# Patient Record
Sex: Male | Born: 1935 | Race: White | Hispanic: No | Marital: Married | Smoking: Current every day smoker
Health system: Southern US, Community
[De-identification: ages and names within clinical notes are randomized; demographics above are authoritative.]

## PROBLEM LIST (undated history)

## (undated) DIAGNOSIS — T4145XA Adverse effect of unspecified anesthetic, initial encounter: Secondary | ICD-10-CM

## (undated) DIAGNOSIS — I509 Heart failure, unspecified: Secondary | ICD-10-CM

## (undated) DIAGNOSIS — Z720 Tobacco use: Secondary | ICD-10-CM

## (undated) DIAGNOSIS — C61 Malignant neoplasm of prostate: Secondary | ICD-10-CM

## (undated) DIAGNOSIS — Z9981 Dependence on supplemental oxygen: Secondary | ICD-10-CM

## (undated) DIAGNOSIS — F329 Major depressive disorder, single episode, unspecified: Secondary | ICD-10-CM

## (undated) DIAGNOSIS — F32A Depression, unspecified: Secondary | ICD-10-CM

## (undated) DIAGNOSIS — J189 Pneumonia, unspecified organism: Secondary | ICD-10-CM

## (undated) DIAGNOSIS — I10 Essential (primary) hypertension: Secondary | ICD-10-CM

## (undated) DIAGNOSIS — M1990 Unspecified osteoarthritis, unspecified site: Secondary | ICD-10-CM

## (undated) DIAGNOSIS — T8859XA Other complications of anesthesia, initial encounter: Secondary | ICD-10-CM

## (undated) DIAGNOSIS — F419 Anxiety disorder, unspecified: Secondary | ICD-10-CM

## (undated) DIAGNOSIS — G43909 Migraine, unspecified, not intractable, without status migrainosus: Secondary | ICD-10-CM

## (undated) DIAGNOSIS — J449 Chronic obstructive pulmonary disease, unspecified: Secondary | ICD-10-CM

## (undated) HISTORY — PX: PROSTATE BIOPSY: SHX241

## (undated) HISTORY — PX: CATARACT EXTRACTION: SUR2

---

## 2003-05-30 HISTORY — PX: MASS EXCISION: SHX2000

## 2003-06-19 ENCOUNTER — Ambulatory Visit (HOSPITAL_COMMUNITY): Admission: RE | Admit: 2003-06-19 | Discharge: 2003-06-19 | Payer: Self-pay | Admitting: General Surgery

## 2006-11-23 ENCOUNTER — Ambulatory Visit: Admission: RE | Admit: 2006-11-23 | Discharge: 2007-02-08 | Payer: Self-pay | Admitting: Radiation Oncology

## 2010-10-14 NOTE — H&P (Signed)
NAME:  Patrick Hobbs, Patrick Hobbs                           ACCOUNT NO.:  [id number]   MEDICAL RECORD NO.:  [id number]                  PATIENT TYPE:   LOCATION:                                       FACILITY:   PHYSICIAN:  Dalia Heading, M.D.               DATE OF BIRTH:  21-Aug-1933   DATE OF ADMISSION:  DATE OF DISCHARGE:                                HISTORY & PHYSICAL   CHIEF COMPLAINT:  Enlarging mass, left ear.   HISTORY OF PRESENT ILLNESS:  Patient is a 77 year old white male who was  referred for evaluation and treatment of a mass behind his left ear.  It has  been present for some time, but recently it has been increasing in size.  No  drainage has been noted.   PAST MEDICAL HISTORY:  Includes hypertension.   PAST SURGICAL HISTORY:  Unremarkable.   CURRENT MEDICATIONS:  Blood pressure pills.   ALLERGIES:  No known drug allergies.   REVIEW OF SYSTEMS:  Patient does smoke daily.  He denies any significant  alcohol use.  He denies any recent MI, angina, shortness of breath.  He  denies any bleeding disorders.   PHYSICAL EXAMINATION:  GENERAL:  Patient is a well-developed and well-  nourished white male in no acute distress.  He is afebrile.  VITAL SIGNS:  Stable.  HEENT:  A 2 cm cystic mass in the left posterior auricular region with a  wide base.  LUNGS:  Clear to auscultation with equal breath sounds bilaterally.  HEART:  Regular rate and rhythm without S3, S4, or murmurs.   IMPRESSION:  Cystic mass, left ear.   PLAN:  Patient is scheduled for excision of the mass, left ear, on June 19, 2003.  The risks and benefits for the procedure, including bleeding and  infection, were fully explained to the patient.  Gave informed consent.     ___________________________________________                                         Dalia Heading, M.D.   MAJ/MEDQ  D:  06/11/2003  T:  06/11/2003  Job:  [id number]   cc:   Kingsley Callander. Ouida Sills, M.D.  [address]  [place]  [place]  [address]  Fax: [phone]

## 2010-10-14 NOTE — Op Note (Signed)
NAME:  Patrick Hobbs, Patrick Hobbs                       ACCOUNT NO.:  [id number]   MEDICAL RECORD NO.:  [id number]                   PATIENT TYPE:  AMB   LOCATION:  DAY                                  FACILITY:  [facility]   PHYSICIAN:  Dalia Heading, M.D.               DATE OF BIRTH:  [birth year]   DATE OF PROCEDURE:  06/19/2003  DATE OF DISCHARGE:                                 OPERATIVE REPORT   PREOPERATIVE DIAGNOSIS:  Enlarging mass, left posterior auricle.   POSTOPERATIVE DIAGNOSIS:  Enlarging mass, left posterior auricle, cyst.   PROCEDURE:  Excision of a 2 cm mass, left posterior auricle.   SURGEON:  Dalia Heading, M.D.   ANESTHESIA:  General.   INDICATIONS:  The patient is a 77 year old white male who presents with an  enlarging mass behind his left ear.  The risks and benefits of the procedure  including bleeding and infection were fully explained to the patient, who  gave informed consent.   PROCEDURE NOTE:  The patient was placed in the supine position.  After  general anesthesia was administered, the left posterior auricular region was  prepped and draped using the usual sterile technique with Betadine.  Surgical site confirmation was performed.   Incision was made over the mass. A sebaceous cyst was found.  The cyst was  excised in total without difficulty.  Any bleeding was controlled using  Bovie electrocautery.  The skin was reapproximated using 5-0 nylon  interrupted sutures.  Neosporin ointment was then applied.   All tape and needle counts were correct at the end of the procedure.  The  patient was awakened and transferred to PACU in stable condition.   COMPLICATIONS:  None.   SPECIMEN:  Sebaceous cyst.   BLOOD LOSS:  Minimal.      ___________________________________________                                            Dalia Heading, M.D.   MAJ/MEDQ  D:  06/19/2003  T:  06/19/2003  Job:  [id number]   cc:   Kingsley Callander. Ouida Sills, M.D.  [address]  [place]  [place] [address]  Fax: [phone]

## 2015-07-20 ENCOUNTER — Encounter (HOSPITAL_COMMUNITY): Payer: Self-pay | Admitting: *Deleted

## 2015-07-20 ENCOUNTER — Inpatient Hospital Stay (HOSPITAL_COMMUNITY)
Admission: EM | Admit: 2015-07-20 | Discharge: 2015-07-22 | DRG: 871 | Disposition: A | Discharge status: Home / Self Care | Payer: Medicare Other | Attending: Internal Medicine | Admitting: Internal Medicine

## 2015-07-20 ENCOUNTER — Emergency Department (HOSPITAL_COMMUNITY): Payer: Medicare Other

## 2015-07-20 DIAGNOSIS — A419 Sepsis, unspecified organism: Principal | ICD-10-CM | POA: Diagnosis present

## 2015-07-20 DIAGNOSIS — E872 Acidosis: Secondary | ICD-10-CM

## 2015-07-20 DIAGNOSIS — J9601 Acute respiratory failure with hypoxia: Secondary | ICD-10-CM | POA: Diagnosis present

## 2015-07-20 DIAGNOSIS — J441 Chronic obstructive pulmonary disease with (acute) exacerbation: Secondary | ICD-10-CM | POA: Diagnosis present

## 2015-07-20 DIAGNOSIS — E43 Unspecified severe protein-calorie malnutrition: Secondary | ICD-10-CM | POA: Insufficient documentation

## 2015-07-20 DIAGNOSIS — E878 Other disorders of electrolyte and fluid balance, not elsewhere classified: Secondary | ICD-10-CM | POA: Diagnosis present

## 2015-07-20 DIAGNOSIS — R509 Fever, unspecified: Secondary | ICD-10-CM | POA: Diagnosis not present

## 2015-07-20 DIAGNOSIS — E871 Hypo-osmolality and hyponatremia: Secondary | ICD-10-CM | POA: Diagnosis present

## 2015-07-20 DIAGNOSIS — F17200 Nicotine dependence, unspecified, uncomplicated: Secondary | ICD-10-CM | POA: Diagnosis present

## 2015-07-20 DIAGNOSIS — J101 Influenza due to other identified influenza virus with other respiratory manifestations: Secondary | ICD-10-CM | POA: Diagnosis present

## 2015-07-20 DIAGNOSIS — D751 Secondary polycythemia: Secondary | ICD-10-CM | POA: Diagnosis present

## 2015-07-20 DIAGNOSIS — I10 Essential (primary) hypertension: Secondary | ICD-10-CM | POA: Diagnosis present

## 2015-07-20 DIAGNOSIS — Z6820 Body mass index (BMI) 20.0-20.9, adult: Secondary | ICD-10-CM

## 2015-07-20 DIAGNOSIS — E8729 Other acidosis: Secondary | ICD-10-CM

## 2015-07-20 DIAGNOSIS — Z66 Do not resuscitate: Secondary | ICD-10-CM | POA: Diagnosis present

## 2015-07-20 HISTORY — DX: Chronic obstructive pulmonary disease, unspecified: J44.9

## 2015-07-20 HISTORY — DX: Heart failure, unspecified: I50.9

## 2015-07-20 HISTORY — DX: Essential (primary) hypertension: I10

## 2015-07-20 HISTORY — DX: Tobacco use: Z72.0

## 2015-07-20 LAB — CBC WITH DIFFERENTIAL/PLATELET
BASOS ABS: 0 10*3/uL (ref 0.0–0.1)
BASOS PCT: 0 %
EOS ABS: 0.1 10*3/uL (ref 0.0–0.7)
EOS PCT: 1 %
HCT: 53 % — ABNORMAL HIGH (ref 39.0–52.0)
Hemoglobin: 18.3 g/dL — ABNORMAL HIGH (ref 13.0–17.0)
Lymphocytes Relative: 14 %
Lymphs Abs: 1.7 10*3/uL (ref 0.7–4.0)
MCH: 31.8 pg (ref 26.0–34.0)
MCHC: 34.5 g/dL (ref 30.0–36.0)
MCV: 92 fL (ref 78.0–100.0)
Monocytes Absolute: 1.4 10*3/uL — ABNORMAL HIGH (ref 0.1–1.0)
Monocytes Relative: 11 %
Neutro Abs: 9.3 10*3/uL — ABNORMAL HIGH (ref 1.7–7.7)
Neutrophils Relative %: 74 %
PLATELETS: 226 10*3/uL (ref 150–400)
RBC: 5.76 MIL/uL (ref 4.22–5.81)
RDW: 13.5 % (ref 11.5–15.5)
WBC: 12.5 10*3/uL — AB (ref 4.0–10.5)

## 2015-07-20 LAB — COMPREHENSIVE METABOLIC PANEL
ALT: 20 U/L (ref 17–63)
AST: 25 U/L (ref 15–41)
Albumin: 4.2 g/dL (ref 3.5–5.0)
Alkaline Phosphatase: 59 U/L (ref 38–126)
Anion gap: 11 (ref 5–15)
BILIRUBIN TOTAL: 0.6 mg/dL (ref 0.3–1.2)
BUN: 27 mg/dL — AB (ref 6–20)
CALCIUM: 9.1 mg/dL (ref 8.9–10.3)
CO2: 29 mmol/L (ref 22–32)
CREATININE: 0.87 mg/dL (ref 0.61–1.24)
Chloride: 93 mmol/L — ABNORMAL LOW (ref 101–111)
GFR calc Af Amer: >60 mL/min (ref >60)
Glucose, Bld: 152 mg/dL — ABNORMAL HIGH (ref 65–99)
Potassium: 4.3 mmol/L (ref 3.5–5.1)
Sodium: 133 mmol/L — ABNORMAL LOW (ref 135–145)
TOTAL PROTEIN: 8.3 g/dL — AB (ref 6.5–8.1)

## 2015-07-20 LAB — BLOOD GAS, ARTERIAL
Acid-Base Excess: 1.8 mmol/L (ref 0.0–2.0)
Bicarbonate: 24.3 mEq/L — ABNORMAL HIGH (ref 20.0–24.0)
Drawn by: 21310
FIO2: 100
O2 Saturation: 99 %
PCO2 ART: 60.9 mmHg — AB (ref 35.0–45.0)
PH ART: 7.284 — AB (ref 7.350–7.450)
Patient temperature: 37.8
TCO2: 23 mmol/L (ref 0–100)
pO2, Arterial: 208 mmHg — ABNORMAL HIGH (ref 80.0–100.0)

## 2015-07-20 LAB — I-STAT CG4 LACTIC ACID, ED: LACTIC ACID, VENOUS: 3.38 mmol/L — AB (ref 0.5–2.0)

## 2015-07-20 LAB — BRAIN NATRIURETIC PEPTIDE: B Natriuretic Peptide: 103 pg/mL — ABNORMAL HIGH (ref 0.0–100.0)

## 2015-07-20 MED ORDER — PIPERACILLIN-TAZOBACTAM 3.375 G IVPB 30 MIN
3.3750 g | Freq: Once | INTRAVENOUS | Status: AC
  Administered 2015-07-20: 3.375 g via INTRAVENOUS
  Filled 2015-07-20: qty 50

## 2015-07-20 MED ORDER — ACETAMINOPHEN 500 MG PO TABS
1000.0000 mg | ORAL_TABLET | Freq: Once | ORAL | Status: AC
Start: 2015-07-20 — End: 2015-07-20
  Administered 2015-07-20: 1000 mg via ORAL
  Filled 2015-07-20: qty 2

## 2015-07-20 MED ORDER — METHYLPREDNISOLONE SODIUM SUCC 125 MG IJ SOLR
125.0000 mg | Freq: Once | INTRAMUSCULAR | Status: AC
  Administered 2015-07-20: 125 mg via INTRAVENOUS
  Filled 2015-07-20: qty 2

## 2015-07-20 MED ORDER — ALBUTEROL (5 MG/ML) CONTINUOUS INHALATION SOLN
15.0000 mg/h | INHALATION_SOLUTION | Freq: Once | RESPIRATORY_TRACT | Status: AC
  Administered 2015-07-20: 15 mg/h via RESPIRATORY_TRACT
  Filled 2015-07-20: qty 20

## 2015-07-20 MED ORDER — SODIUM CHLORIDE 0.9 % IV BOLUS (SEPSIS)
1000.0000 mL | INTRAVENOUS | Status: AC
  Administered 2015-07-20 – 2015-07-21 (×2): 1000 mL via INTRAVENOUS

## 2015-07-20 MED ORDER — IPRATROPIUM BROMIDE 0.02 % IN SOLN
0.5000 mg | Freq: Once | RESPIRATORY_TRACT | Status: AC
  Administered 2015-07-20: 0.5 mg via RESPIRATORY_TRACT
  Filled 2015-07-20: qty 2.5

## 2015-07-20 MED ORDER — VANCOMYCIN HCL IN DEXTROSE 1-5 GM/200ML-% IV SOLN
1000.0000 mg | Freq: Once | INTRAVENOUS | Status: AC
  Administered 2015-07-21: 1000 mg via INTRAVENOUS
  Filled 2015-07-20: qty 200

## 2015-07-20 MED ORDER — SODIUM CHLORIDE 0.9 % IV SOLN
INTRAVENOUS | Status: AC
  Administered 2015-07-21: 1000 mL via INTRAVENOUS

## 2015-07-20 NOTE — ED Provider Notes (Signed)
By signing my name below, I, Patrick Hobbs, attest that this documentation has been prepared under the direction and in the presence of [place], DO.  Electronically Signed: Forrestine Hobbs, ED Scribe. 07/20/2015. 11:18 PM.   TIME SEEN: 11:14 PM   CHIEF COMPLAINT:  Chief Complaint  Patient presents with  . Shortness of Breath     HPI:  HPI Comments: Patrick Hobbs brought in by EMS is a 82 y.o. male with a PMHx of COPD and HTN who presents to the Emergency Department complaining of constant, ongoing shortness of breath onset 8:30 PM this evening. Pt also reports a productive cough consisting of white mucous and chills. Fever of 100.1 recorded rectally upon arrival. No aggravating or alleviating factors for symptoms. No OTC medications or home treatments/remedies attempted prior to arrival. Pt denies any nausea, vomiting, or chest pain. Pt received his Flu vaccination this season. Pt does not wear Oxygen at home. No prior history of heart disease or heart failure.  PCP:  Dr. Marlon Pel- [facility]   ROS: See HPI Constitutional: Positive fever and chills Eyes: no drainage  ENT: no runny nose   Cardiovascular:  no chest pain  Resp: Positive SOB and cough GI: no vomiting GU: no dysuria Integumentary: no rash  Allergy: no hives  Musculoskeletal: no leg swelling  Neurological: no slurred speech ROS otherwise negative  PAST MEDICAL HISTORY/PAST SURGICAL HISTORY:  No past medical history on file.  MEDICATIONS:  Prior to Admission medications   Medication Sig Start Date End Date Taking? Authorizing Provider  albuterol (PROVENTIL HFA;VENTOLIN HFA) 108 (90 Base) MCG/ACT inhaler Inhale 1-2 puffs into the lungs every 6 (six) hours as needed for wheezing or shortness of breath.   Yes Historical Provider, MD  amLODipine (NORVASC) 10 MG tablet Take 10 mg by mouth daily.   Yes Historical Provider, MD  fluticasone (FLOVENT HFA) 110 MCG/ACT inhaler Inhale 2 puffs into the lungs  2 (two) times daily.   Yes Historical Provider, MD  metoprolol succinate (TOPROL-XL) 100 MG 24 hr tablet Take 100 mg by mouth daily. Take with or immediately following a meal.   Yes Historical Provider, MD  predniSONE (DELTASONE) 10 MG tablet Take 5-20 mg by mouth daily with breakfast. Take 2 tablets twice daily for 4 days, then take 1 tablet twice daily for 4 days, then take 1 tablet once daily for 4 days, then take 0.5 tablet once daily for 4 days, then STOP.   Yes Historical Provider, MD  NASONEX 50 MCG/ACT nasal spray Place 1-2 sprays into both nostrils daily. 07/15/15   Historical Provider, MD    ALLERGIES:  Allergies not on file  SOCIAL HISTORY:  Social History  Substance Use Topics  . Smoking status: Not on file  . Smokeless tobacco: Not on file  . Alcohol Use: Not on file    FAMILY HISTORY: No family history on file.  EXAM: BP 147/111 mmHg  Pulse 104  Temp(Src) 98.1 F (36.7 C) (Oral)  Resp 36  SpO2 91% CONSTITUTIONAL: Alert and oriented and responds appropriately to questions. Elderly and chronically ill appearing; febrile and appears in respiratory distress HEAD: Normocephalic EYES: Conjunctivae clear, PERRL ENT: normal nose; no rhinorrhea; moist mucous membranes; pharynx without lesions noted NECK: Supple, no meningismus, no LAD  CARD: R and Tachycardic; S1 and S2 appreciated; no murmurs, no clicks, no rubs, no gallops RESP: Tachypnic, diffuse rhonchi and expiratory wheezing noted, hypoxic on room air, speaking in short sentences; diminished airation diffusely  ABD/GI:  Normal bowel sounds; non-distended; soft, non-tender, no rebound, no guarding, no peritoneal signs BACK:  The back appears normal and is non-tender to palpation, there is no CVA tenderness EXT: Normal ROM in all joints; non-tender to palpation; no edema; normal capillary refill; no cyanosis, no calf tenderness or swelling    SKIN: Normal color for age and race; warm NEURO: Moves all extremities equally,  sensation to light touch intact diffusely, cranial nerves II through XII intact PSYCH: The patient's mood and manner are appropriate. Grooming and personal hygiene are appropriate.  MEDICAL DECISION MAKING: Patient here with moderate respiratory distress, fever, tachycardia, tachypnea. Code sepsis initiated. We'll give IV fluids, broad-spectrum antibiotics. We'll obtain labs and cultures. We'll obtain a chest x-ray, urine and influenza swab. Patient is also having a COPD exacerbation. We'll give continuous albuterol, Solu-Medrol. We'll obtain an ABG. Patient is adamant that he is a DO NOT RESUSCITATE/DO NOT INTUBATE. Wife and daughter at bedside.  ED PROGRESS: Patient's labs show laxative 3.38. He also has a pH of 7.2 wait for the PCO2 of 60.9. We'll start Hobbs on BiPAP. He does have a leukocytosis with left shift. Chest x-ray shows no infiltrate or edema. We'll discuss with hospitalist for admission.   12:40 AM  D/w Dr. Myna Hidalgo with hospitalist service who agrees on admission. Patient is doing well on BiPAP and looks much more comfortable. Flu swab is pending. I will place holding orders for an inpatient, stepdown bed per hospitalist request. Care transferred to hospitalist service.   Patient has come back flu be positive.    EKG Interpretation  Date/Time:  Tuesday July 20 2015 23:07:07 EST Ventricular Rate:  110 PR Interval:    QRS Duration: 92 QT Interval:  304 QTC Calculation: 411 R Axis:   75 Text Interpretation:  Sinus tachycardia Anteroseptal infarct, age indeterminate ST elevation, consider inferior injury Lateral leads are also involved Artifact in lead(s) I II III aVL V1 V2 V4 V6 and baseline wander in lead(s) I III aVL V6 Artifact No previous ECGs available Confirmed by Wyvonnia Dusky  MD, STEPHEN [phone]) on 07/20/2015 11:12:51 PM         CRITICAL CARE Performed by: Nyra Jabs   Total critical care time: 50 minutes  Critical care time was exclusive of separately billable  procedures and treating other patients.  Critical care was necessary to treat or prevent imminent or life-threatening deterioration.  Critical care was time spent personally by me on the following activities: development of treatment plan with patient and/or surrogate as well as nursing, discussions with consultants, evaluation of patient's response to treatment, examination of patient, obtaining history from patient or surrogate, ordering and performing treatments and interventions, ordering and review of laboratory studies, ordering and review of radiographic studies, pulse oximetry and re-evaluation of patient's condition.       I personally performed the services described in this documentation, which was scribed in my presence. The recorded information has been reviewed and is accurate.   [place], DO 07/21/15 [phone]

## 2015-07-20 NOTE — ED Notes (Signed)
Lactic Acid 3.38, EDP notified

## 2015-07-20 NOTE — ED Notes (Signed)
Pt states wife called EMS, pt with respiratoy distress

## 2015-07-20 NOTE — ED Notes (Signed)
ccems states pt began c/o sob that started earlier today around 7:30 am and has gotten progressively worse

## 2015-07-21 ENCOUNTER — Encounter (HOSPITAL_COMMUNITY): Payer: Self-pay | Admitting: *Deleted

## 2015-07-21 DIAGNOSIS — A419 Sepsis, unspecified organism: Secondary | ICD-10-CM | POA: Diagnosis present

## 2015-07-21 DIAGNOSIS — E871 Hypo-osmolality and hyponatremia: Secondary | ICD-10-CM | POA: Diagnosis present

## 2015-07-21 DIAGNOSIS — E43 Unspecified severe protein-calorie malnutrition: Secondary | ICD-10-CM | POA: Insufficient documentation

## 2015-07-21 DIAGNOSIS — D751 Secondary polycythemia: Secondary | ICD-10-CM | POA: Insufficient documentation

## 2015-07-21 DIAGNOSIS — R509 Fever, unspecified: Secondary | ICD-10-CM | POA: Diagnosis present

## 2015-07-21 DIAGNOSIS — Z6820 Body mass index (BMI) 20.0-20.9, adult: Secondary | ICD-10-CM | POA: Diagnosis not present

## 2015-07-21 DIAGNOSIS — J101 Influenza due to other identified influenza virus with other respiratory manifestations: Secondary | ICD-10-CM

## 2015-07-21 DIAGNOSIS — Z66 Do not resuscitate: Secondary | ICD-10-CM | POA: Diagnosis present

## 2015-07-21 DIAGNOSIS — F17200 Nicotine dependence, unspecified, uncomplicated: Secondary | ICD-10-CM | POA: Diagnosis present

## 2015-07-21 DIAGNOSIS — I10 Essential (primary) hypertension: Secondary | ICD-10-CM | POA: Diagnosis not present

## 2015-07-21 DIAGNOSIS — J9601 Acute respiratory failure with hypoxia: Secondary | ICD-10-CM | POA: Diagnosis not present

## 2015-07-21 DIAGNOSIS — E878 Other disorders of electrolyte and fluid balance, not elsewhere classified: Secondary | ICD-10-CM | POA: Diagnosis present

## 2015-07-21 DIAGNOSIS — J441 Chronic obstructive pulmonary disease with (acute) exacerbation: Secondary | ICD-10-CM | POA: Diagnosis not present

## 2015-07-21 DIAGNOSIS — Z720 Tobacco use: Secondary | ICD-10-CM

## 2015-07-21 LAB — URINE MICROSCOPIC-ADD ON

## 2015-07-21 LAB — INFLUENZA PANEL BY PCR (TYPE A & B)
H1N1 flu by pcr: NOT DETECTED
Influenza A By PCR: NEGATIVE
Influenza B By PCR: POSITIVE — AB

## 2015-07-21 LAB — URINALYSIS, ROUTINE W REFLEX MICROSCOPIC
BILIRUBIN URINE: NEGATIVE
GLUCOSE, UA: NEGATIVE mg/dL
KETONES UR: NEGATIVE mg/dL
Leukocytes, UA: NEGATIVE
Nitrite: NEGATIVE
PH: 6 (ref 5.0–8.0)
Specific Gravity, Urine: 1.015 (ref 1.005–1.030)

## 2015-07-21 LAB — STREP PNEUMONIAE URINARY ANTIGEN: STREP PNEUMO URINARY ANTIGEN: NEGATIVE

## 2015-07-21 LAB — PROCALCITONIN: Procalcitonin: 0.17 ng/mL

## 2015-07-21 LAB — MRSA PCR SCREENING: MRSA BY PCR: NEGATIVE

## 2015-07-21 MED ORDER — METHYLPREDNISOLONE SODIUM SUCC 125 MG IJ SOLR
60.0000 mg | Freq: Two times a day (BID) | INTRAMUSCULAR | Status: DC
  Administered 2015-07-21 – 2015-07-22 (×2): 60 mg via INTRAVENOUS
  Filled 2015-07-21 (×2): qty 2

## 2015-07-21 MED ORDER — ACETAMINOPHEN 325 MG PO TABS
650.0000 mg | ORAL_TABLET | Freq: Four times a day (QID) | ORAL | Status: DC | PRN
  Administered 2015-07-21: 650 mg via ORAL
  Filled 2015-07-21: qty 2

## 2015-07-21 MED ORDER — ENOXAPARIN SODIUM 40 MG/0.4ML ~~LOC~~ SOLN: 40.0000 mg | SUBCUTANEOUS | Status: DC

## 2015-07-21 MED ORDER — IPRATROPIUM-ALBUTEROL 0.5-2.5 (3) MG/3ML IN SOLN: 3.0000 mL | RESPIRATORY_TRACT | Status: DC | PRN

## 2015-07-21 MED ORDER — ADULT MULTIVITAMIN W/MINERALS CH
1.0000 | ORAL_TABLET | Freq: Every day | ORAL | Status: DC
  Administered 2015-07-21 – 2015-07-22 (×2): 1 via ORAL
  Filled 2015-07-21 (×2): qty 1

## 2015-07-21 MED ORDER — DEXTROSE 5 % IV SOLN
INTRAVENOUS | Status: AC
  Filled 2015-07-21: qty 10

## 2015-07-21 MED ORDER — AMLODIPINE BESYLATE 5 MG PO TABS
10.0000 mg | ORAL_TABLET | Freq: Every day | ORAL | Status: DC
  Administered 2015-07-21 – 2015-07-22 (×2): 10 mg via ORAL
  Filled 2015-07-21 (×2): qty 2

## 2015-07-21 MED ORDER — SODIUM CHLORIDE 0.9 % IV SOLN
INTRAVENOUS | Status: AC
  Administered 2015-07-21: 04:00:00 via INTRAVENOUS

## 2015-07-21 MED ORDER — DEXTROSE 5 % IV SOLN
1.0000 g | INTRAVENOUS | Status: DC
  Administered 2015-07-21: 1 g via INTRAVENOUS
  Filled 2015-07-21 (×2): qty 10

## 2015-07-21 MED ORDER — ENSURE ENLIVE PO LIQD
237.0000 mL | Freq: Two times a day (BID) | ORAL | Status: DC
  Administered 2015-07-21 – 2015-07-22 (×2): 237 mL via ORAL

## 2015-07-21 MED ORDER — METOPROLOL SUCCINATE ER 50 MG PO TB24
100.0000 mg | ORAL_TABLET | Freq: Every day | ORAL | Status: DC
  Administered 2015-07-21 – 2015-07-22 (×2): 100 mg via ORAL
  Filled 2015-07-21 (×2): qty 2
  Filled 2015-07-21: qty 1

## 2015-07-21 MED ORDER — OSELTAMIVIR PHOSPHATE 30 MG PO CAPS
30.0000 mg | ORAL_CAPSULE | Freq: Two times a day (BID) | ORAL | Status: DC
  Administered 2015-07-21 – 2015-07-22 (×3): 30 mg via ORAL
  Filled 2015-07-21 (×5): qty 1

## 2015-07-21 MED ORDER — METHYLPREDNISOLONE SODIUM SUCC 125 MG IJ SOLR
60.0000 mg | Freq: Four times a day (QID) | INTRAMUSCULAR | Status: DC
  Administered 2015-07-21 (×2): 60 mg via INTRAVENOUS
  Filled 2015-07-21 (×2): qty 2

## 2015-07-21 MED ORDER — CETYLPYRIDINIUM CHLORIDE 0.05 % MT LIQD
7.0000 mL | Freq: Two times a day (BID) | OROMUCOSAL | Status: DC
  Administered 2015-07-21 – 2015-07-22 (×4): 7 mL via OROMUCOSAL

## 2015-07-21 MED ORDER — ENOXAPARIN SODIUM 40 MG/0.4ML ~~LOC~~ SOLN
40.0000 mg | SUBCUTANEOUS | Status: DC
  Administered 2015-07-21 – 2015-07-22 (×2): 40 mg via SUBCUTANEOUS
  Filled 2015-07-21 (×2): qty 0.4

## 2015-07-21 MED ORDER — DEXTROSE 5 % IV SOLN
500.0000 mg | INTRAVENOUS | Status: DC
  Administered 2015-07-21 – 2015-07-22 (×2): 500 mg via INTRAVENOUS
  Filled 2015-07-21 (×2): qty 500

## 2015-07-21 MED ORDER — IPRATROPIUM-ALBUTEROL 0.5-2.5 (3) MG/3ML IN SOLN
3.0000 mL | Freq: Four times a day (QID) | RESPIRATORY_TRACT | Status: DC
  Administered 2015-07-21 – 2015-07-22 (×6): 3 mL via RESPIRATORY_TRACT
  Filled 2015-07-21 (×4): qty 3

## 2015-07-21 MED ORDER — DEXTROSE 5 % IV SOLN
INTRAVENOUS | Status: AC
  Filled 2015-07-21: qty 500

## 2015-07-21 MED ORDER — VANCOMYCIN HCL 500 MG IV SOLR
500.0000 mg | Freq: Two times a day (BID) | INTRAVENOUS | Status: DC
  Administered 2015-07-21: 500 mg via INTRAVENOUS
  Filled 2015-07-21 (×3): qty 500

## 2015-07-21 NOTE — H&P (Signed)
[facility] History and Physical  Patrick Hobbs [id number] DOB: 02-Mar-1934 DOA: 07/20/2015  Referring physician: ED physician PCP: No primary care provider on file.  Specialists: None listed   Chief Complaint:  Dyspnea, fever, cough   HPI: Patrick Hobbs is a 82 y.o. male with PMH of COPD without home O2 requirement, hypertension, and ongoing tobacco abuse who presents to the ED with approximately 3 days of dyspnea, productive cough, and subjective fevers. Patrick Hobbs at been in his usual state of health until approximately 3 days ago when he developed subjective fever and chills with concomitant exertional dyspnea and cough productive of thick green sputum. The symptoms continued to worsen over the ensuing days, prompting his visit to the ER tonight. Patient suffers from COPD and it seen his primary care physician just over a week ago for dyspnea and was placed on prednisone taper which he continues today. Despite this, his symptoms have progressed and he is now dyspneic at rest. He denies any chest pain or palpitations, and denies abdominal pain, nausea, vomiting, or diarrhea. Patient reports that he received his flu shot earlier in the season and has had no sick contacts. There is been no long distance travel, prolonged immobilization, or unilateral leg swelling, erythema, or tenderness. Aside from the prednisone taper and his rescue inhaler, patient has not attempted any interventions for the current illness. He has had similar symptoms previously with COPD exacerbations.  In ED, patient was found to be in severe respiratory distress, requiring supplemental oxygen to maintain saturations in the mid 90s. He was tachypnea with rate in the 30s and tachycardic to 120. Blood pressure remained stable. EKG feature to sinus tachycardia with rate of 110 and significant background artifact that compromises further interpretation. Chest x-ray was notable for hyperinflation but there is no definite  acute findings. Blood work returned with a hyponatremia and hypochloremia on chem panel, and leukocytosis to 12,500 as well as polycythemia on the CBC. Blood gas was obtained and pH is 7.2 weight with PCO2 of 61. Lactic acid returns elevated to a value of 3.38. Patient was bolused with 1 L of normal saline, vancomycin and Zosyn were administered empirically, nebulized breathing treatments were given, and a 125 mg IV Solu-Medrol was pushed in the ED. Patient required BiPAP to maintain adequate saturations and will be admitted to the stepdown unit for ongoing evaluation and management of acute respiratory failure with hypoxia suspected secondary to COPD exacerbation with infectious precipitant.  Where does patient live?   At home    Can patient participate in ADLs?  Yes       Review of Systems:   General: no sweats, weight change, poor appetite, or fatigue. Fever, chills HEENT: no blurry vision, hearing changes or sore throat Pulm:  Dyspnea, cough productive of green sputum, wheeze CV: no chest pain or palpitations Abd: no nausea, vomiting, abdominal pain, diarrhea, or constipation GU: no dysuria, hematuria, increased urinary frequency, or urgency  Ext: no leg edema Neuro: no focal weakness, numbness, or tingling, no vision change or hearing loss Skin: no rash, no wounds MSK: No muscle spasm, no deformity, no red, hot, or swollen joint Heme: No easy bruising or bleeding Travel history: No recent long distant travel    Allergy: No Known Allergies  Past Medical History  Diagnosis Date  . COPD (chronic obstructive pulmonary disease) ([place])   . Hypertension   . CHF (congestive heart failure) ([place])   . Tobacco use   . Prostate neoplasm  History reviewed. No pertinent past surgical history.  Social History:  reports that he has been smoking.  He does not have any smokeless tobacco history on file. He reports that he does not drink alcohol or use illicit drugs.  Family History: History  reviewed. No pertinent family history.   Prior to Admission medications   Medication Sig Start Date End Date Taking? Authorizing Provider  albuterol (PROVENTIL HFA;VENTOLIN HFA) 108 (90 Base) MCG/ACT inhaler Inhale 1-2 puffs into the lungs every 6 (six) hours as needed for wheezing or shortness of breath.   Yes Historical Provider, MD  amLODipine (NORVASC) 10 MG tablet Take 10 mg by mouth daily.   Yes Historical Provider, MD  fluticasone (FLOVENT HFA) 110 MCG/ACT inhaler Inhale 2 puffs into the lungs 2 (two) times daily.   Yes Historical Provider, MD  metoprolol succinate (TOPROL-XL) 100 MG 24 hr tablet Take 100 mg by mouth daily. Take with or immediately following a meal.   Yes Historical Provider, MD  NASONEX 50 MCG/ACT nasal spray Place 1-2 sprays into both nostrils daily. 07/15/15  Yes Historical Provider, MD  predniSONE (DELTASONE) 10 MG tablet Take 5-20 mg by mouth daily with breakfast. Take 2 tablets twice daily for 4 days, then take 1 tablet twice daily for 4 days, then take 1 tablet once daily for 4 days, then take 0.5 tablet once daily for 4 days, then STOP.   Yes Historical Provider, MD    Physical Exam: Filed Vitals:   07/21/15 0000 07/21/15 0030 07/21/15 0130 07/21/15 0200  BP: 142/77 104/60 100/57 115/66  Pulse: 115 112 103 101  Temp:      TempSrc:      Resp: 34 26 25   SpO2: 97% 98% 96% 96%   General: Acute respiratory distress with tachypnea and accessory muscle recruitment, on BiPAP HEENT:       Eyes: PERRL, EOMI, no scleral icterus or conjunctival pallor.       ENT: No discharge from the ears or nose, no pharyngeal ulcers, petechiae or exudate.        Neck: No JVD, no bruit, no appreciable mass Heme: No cervical adenopathy, no pallor Cardiac: Rate ~120 and regular, soft systolic murmur at LSB, No gallops or rubs. Pulm: Diminished b/l, tachypneic, using neck muscles, coarse ronchi b/l . Abd: Soft, nondistended, nontender, no rebound pain or gaurding, no mass or  organomegaly, BS present. Ext: No LE edema bilaterally. 2+DP/PT pulse bilaterally. Musculoskeletal: No gross deformity, no red, hot, swollen joints   Skin: No rashes or wounds on exposed surfaces  Neuro: Alert, oriented X3, cranial nerves II-XII grossly intact. No focal findings Psych: Patient is not overtly psychotic, appropriate mood and affect.  Labs on Admission:  Basic Metabolic Panel:  Recent Labs Lab 07/20/15 2309  NA 133*  K 4.3  CL 93*  CO2 29  GLUCOSE 152*  BUN 27*  CREATININE 0.87  CALCIUM 9.1   Liver Function Tests:  Recent Labs Lab 07/20/15 2309  AST 25  ALT 20  ALKPHOS 59  BILITOT 0.6  PROT 8.3*  ALBUMIN 4.2   No results for input(s): LIPASE, AMYLASE in the last 168 hours. No results for input(s): AMMONIA in the last 168 hours. CBC:  Recent Labs Lab 07/20/15 2309  WBC 12.5*  NEUTROABS 9.3*  HGB 18.3*  HCT 53.0*  MCV 92.0  PLT 226   Cardiac Enzymes: No results for input(s): CKTOTAL, CKMB, CKMBINDEX, TROPONINI in the last 168 hours.  BNP (last 3 results)  Recent  Labs  07/20/15 2309  BNP 103.0*    ProBNP (last 3 results) No results for input(s): PROBNP in the last 8760 hours.  CBG: No results for input(s): GLUCAP in the last 168 hours.  Radiological Exams on Admission: Dg Chest Port 1 View  07/20/2015  CLINICAL DATA:  Increasing shortness of breath since this morning. Cough and fever. EXAM: PORTABLE CHEST 1 VIEW COMPARISON:  Frontal and lateral views 09/04/2014 FINDINGS: Stable hyperinflation. There is biapical pleural parenchymal scarring. The cardiomediastinal contours are unchanged, heart normal in size. The lungs are clear. Pulmonary vasculature is normal. No consolidation, pleural effusion, or pneumothorax. No acute osseous abnormalities are seen. IMPRESSION: Hyperinflation.  No superimposed acute process. Electronically Signed   By: Jeb Levering M.D.   On: 07/20/2015 23:40    EKG: Independently reviewed.  Abnormal findings:   Sinus tachycardia, rate 110, significant artifact    Assessment/Plan  1. COPD exacerbation with acute hypoxic respiratory failure  - Given associated fevers, there is concern for infectious precipitant  - No definite CXR findings to support PNA, but given critical illness, was started on empiric vanc and Zosyn in ED  - Was on prednisone taper at time of admission; given 125 mg IV Solu-Medrol in ED, will continue with 60 mg IV q6h  - DuoNebs scheduled q6h atc; available q2h prn  - Continue empiric CAP treatment with Rocephin and azithromycin while following cultures  - Continuous pulse oximetry, titrate FiO2 to maintain sats >92%  - Check urine for strep pneumo antigens  - Trend lactate, procalcitonin   2. Sepsis  - Temp 37.8 on arrival, but had just taken APAP at home - WBC is 12.5 (though had been on prednisone), HR 119, RR 36, lactate 3.38, suspected pulmonary source  - Blood and urine cultures incubating, sputum culture requested  - Received empiric dose of vancomycin and Zosyn in ED, continuing with empiric Rocephin and azithromycin  - Was bolused with 1 L NS in ED, continuing IVF with NS at 125 cc/hr overnight  - Trending lactate, procalcitonin, following cultures    3. Hypertension - At goal currently  - Hesitant to treat at this time given sepsis and concern for decompensation  - Monitor, consider treating SBP >180 or DBP >100 with prn IV hydralazine   4. Tobacco abuse  - Counseled toward cessation  - RN to provide smoking cessation information prior to discharge  - Nicotine patch prn   5. Hyponatremia, hypochloremia  - Suspect secondary to dehydration in setting of sepsis  - Anticipate resolution with IVF  - Repeat chem panel tomorrow    6. Polycythemia  - Hgb 18.3, Hct 53.0 on admission  - Suspect this is multifactorial with contributions from hemoconcentration in setting of sepsis, and also chronic hypoxia w/ COPD and ongoing tobacco abuse - Anticipate will normalize  with IVF resuscitation  - Repeat CBC tomorrow     DVT ppx:  SQ Lovenox      Code Status: Full code Family Communication:  Yes, patient's daughter and wife at bed side Disposition Plan: Admit to inpatient   Date of Service 07/21/2015    Vianne Bulls, MD [facility] Pager [phone]  If 7PM-7AM, please contact night-coverage [email or web address] Password [facility] 07/21/2015, 2:23 AM

## 2015-07-21 NOTE — Evaluation (Signed)
Physical Therapy Evaluation Patient Details Name: Patrick Hobbs MRN: [id number] DOB: 06/18/1933 Today's Date: 07/21/2015   History of Present Illness  Patrick Hobbs is a 82 y.o. male with PMH of COPD without home O2 requirement, hypertension, and ongoing tobacco abuse who presents to the ED with approximately 3 days of dyspnea, productive cough, and subjective fevers. Patrick Hobbs at been in his usual state of health until approximately 3 days ago when he developed subjective fever and chills with concomitant exertional dyspnea and cough productive of thick green sputum. The symptoms continued to worsen over the ensuing days, prompting his visit to the ER tonight. Patient suffers from COPD and it seen his primary care physician just over a week ago for dyspnea and was placed on prednisone taper which he continues today. Despite this, his symptoms have progressed and he is now dyspneic at rest. He denies any chest pain or palpitations, and denies abdominal pain, nausea, vomiting, or diarrhea. Patient reports that he received his flu shot earlier in the season and has had no sick contacts. There is been no long distance travel, prolonged immobilization, or unilateral leg swelling, erythema, or tenderness. Aside from the prednisone taper and his rescue inhaler, patient has not attempted any interventions for the current illness. He has had similar symptoms previously with COPD exacerbations.  Clinical Impression  Pt was in bed upon arrival, A&Ox3 and willing to participate in PT as long as he could stay in his room. Not experiencing pain, strength of BUE and BLE was 5/5 via MMT. Functionally, pt reports he is at his baseline function, this was evident by his ability to perform all bed mobility, transfers and ambulation without assistance. He assisted therapist with lines and leads which demonstrates he has good safety awareness. He reports he has been sponge bathing for quite some time and could benefit  from installment of a shower or tub bench to allow him to perform self cleaning safely and independently. Pt does not benefit from skilled PT as this time.    Follow Up Recommendations No PT follow up    Equipment Recommendations  Other (comment) (shower/tub bench for pt to resume indep in bathing; reports he is currently sponge bathing )    Recommendations for Other Services       Precautions / Restrictions Precautions Precautions: Fall Restrictions Weight Bearing Restrictions: No      Mobility  Bed Mobility Overal bed mobility: Modified Independent                Transfers Overall transfer level: Independent                  Ambulation/Gait Ambulation/Gait assistance: Independent (Assistance provided with leads and tubes) Ambulation Distance (Feet): 20 Feet Assistive device: None Gait Pattern/deviations: WFL(Within Functional Limits)     General Gait Details: Pt reports he is walking at his baseline without any difficulties  Stairs Stairs:  (Pt requested to remain in his room but feels he will be able to ascend/descend stairs safely at his home )          Wheelchair Mobility    Modified Rankin (Stroke Patients Only)       Balance Overall balance assessment: No apparent balance deficits (not formally assessed)  Pertinent Vitals/Pain Pain Assessment: No/denies pain    Home Living Family/patient expects to be discharged to:: Private residence Living Arrangements: Spouse/significant other Available Help at Discharge: Family Type of Home: Mobile home Home Access: Stairs to enter Entrance Stairs-Rails: Can reach both Entrance Stairs-Number of Steps: 3 Home Layout: One level Home Equipment: None      Prior Function Level of Independence: Needs assistance   Gait / Transfers Assistance Needed: Reports he is able to go out into the community without assitance; notes that he spends  most of his day laying around  ADL's / Homemaking Assistance Needed: Pt requires assistance from wife with ADL tasks occasionally, including dressing and toileting hygiene        Hand Dominance   Dominant Hand: Right    Extremity/Trunk Assessment   Upper Extremity Assessment: Overall WFL for tasks assessed           Lower Extremity Assessment: Defer to PT evaluation         Communication   Communication: HOH  Cognition Arousal/Alertness: Awake/alert Behavior During Therapy: WFL for tasks assessed/performed Overall Cognitive Status: Within Functional Limits for tasks assessed                      General Comments      Exercises        Assessment/Plan    PT Assessment Patent does not need any further PT services  PT Diagnosis     PT Problem List    PT Treatment Interventions     PT Goals (Current goals can be found in the Care Plan section)      Frequency     Barriers to discharge        Co-evaluation               End of Session Equipment Utilized During Treatment: Gait belt Activity Tolerance: Patient tolerated treatment well Patient left: in chair;with call bell/phone within reach Nurse Communication: Mobility status         Time: [id number] PT Time Calculation (min) (ACUTE ONLY): 37 min   Charges:   PT Evaluation $PT Eval Low Complexity: 1 Procedure PT Treatments $Therapeutic Activity: 23-37 mins   PT G Codes:           9:56 AM,08/10/15 Elly Modena PT, DPT [facility] [phone]

## 2015-07-21 NOTE — Progress Notes (Signed)
Pt a/o.vss. IVF patent. No complaints of any distress. Report given to [facility], Therapist, sports. Pt to be transferred to room 323 via wheelchair.

## 2015-07-21 NOTE — Evaluation (Signed)
Occupational Therapy Evaluation Patient Details Name: Patrick Hobbs MRN: [id number] DOB: 08-05-1933 Today's Date: 07/21/2015    History of Present Illness Patrick Hobbs is a 82 y.o. male with PMH of COPD without home O2 requirement, hypertension, and ongoing tobacco abuse who presents to the ED with approximately 3 days of dyspnea, productive cough, and subjective fevers. Patrick Hobbs at been in his usual state of health until approximately 3 days ago when he developed subjective fever and chills with concomitant exertional dyspnea and cough productive of thick green sputum. The symptoms continued to worsen over the ensuing days, prompting his visit to the ER tonight. Patient suffers from COPD and it seen his primary care physician just over a week ago for dyspnea and was placed on prednisone taper which he continues today. Despite this, his symptoms have progressed and he is now dyspneic at rest. He denies any chest pain or palpitations, and denies abdominal pain, nausea, vomiting, or diarrhea. Patient reports that he received his flu shot earlier in the season and has had no sick contacts. There is been no long distance travel, prolonged immobilization, or unilateral leg swelling, erythema, or tenderness. Aside from the prednisone taper and his rescue inhaler, patient has not attempted any interventions for the current illness. He has had similar symptoms previously with COPD exacerbations.   Clinical Impression   Pt awake, alert, oriented x3 this am, agreeable to evaluation. Pt with BUE ROM and strength WNL, able to perform ADL tasks with mod I in sitting. Pt does required some assistance at home occasionally for dressing and toileting tasks due to difficulty reaching behind back and bending over. Pt appears to be at functional baseline with ADL tasks. No further OT services required.     Follow Up Recommendations  No OT follow up;Supervision - Intermittent    Equipment Recommendations  Tub/shower seat       Precautions / Restrictions Precautions Precautions: Fall Restrictions Weight Bearing Restrictions: No      Mobility Bed Mobility Overal bed mobility: Modified Independent                          ADL Overall ADL's : Needs assistance/impaired                     Lower Body Dressing: Sitting/lateral leans;Modified independent         Toileting - Clothing Manipulation Details (indicate cue type and reason): per pt report, pt has difficulty reaching behind him, therefore wife assists with hygiene tasks   Tub/Shower Transfer Details (indicate cue type and reason): Pt sponge bathes due to decreased activity tolerance with prolonged standing.  Functional mobility during ADLs: Min guard       Vision Vision Assessment?: No apparent visual deficits          Pertinent Vitals/Pain Pain Assessment: No/denies pain     Hand Dominance Right   Extremity/Trunk Assessment Upper Extremity Assessment Upper Extremity Assessment: Overall WFL for tasks assessed   Lower Extremity Assessment Lower Extremity Assessment: Defer to PT evaluation       Communication Communication Communication: HOH   Cognition Arousal/Alertness: Awake/alert Behavior During Therapy: WFL for tasks assessed/performed Overall Cognitive Status: Within Functional Limits for tasks assessed                                Home Living Family/patient expects to be discharged to:: Private  residence Living Arrangements: Spouse/significant other Available Help at Discharge: Family               Bathroom Shower/Tub: Tub/shower unit;Walk-in shower   Bathroom Toilet: Standard     Home Equipment: None          Prior Functioning/Environment Level of Independence: Needs assistance    ADL's / Homemaking Assistance Needed: Pt requires assistance from wife with ADL tasks occasionally, including dressing and toileting hygiene        OT Diagnosis:  Cognitive deficits    End of Session Equipment Utilized During Treatment: Gait belt  Activity Tolerance: Patient tolerated treatment well Patient left: in chair;with call bell/phone within reach   Time: 0924-0939 OT Time Calculation (min): 15 min Charges:  OT General Charges $OT Visit: 1 Procedure OT Evaluation $OT Eval Low Complexity: 1 Procedure  Patrick Hobbs, OTR/L  [phone]  07/21/2015, 9:42 AM

## 2015-07-21 NOTE — Progress Notes (Addendum)
Initial Nutrition Assessment  DOCUMENTATION CODES:  Severe malnutrition in context of chronic illness   Pt meets criteria for SEVERE MALNUTRITION in the context of Chronic Illness as evidenced by Severe muscle wasting and an estimated oral intake that met < or equal to 75% of needs for > or equal to 1 month.  INTERVENTION:  Ensure Enlive po BID, each supplement provides 350 kcal and 20 grams of protein  MVI with minerals in light of prolonged very poor intake w/o supplementation  Pt reports hypoxia with poor po intake, wonder if home O2 would benefit nutrition as well.   Pt reports no appetite at all. Eats "just enough". May consider appetite stimulant  RD spent considerable time educating on ways to maintain wt and combat taste loss.   NUTRITION DIAGNOSIS:  Inadequate oral intake related to poor appetite, taste loss and hypoxia on PO intake as evidenced by moderate depletion of body fat, severe depletion of muscle mass.  GOAL:  Patient will meet greater than or equal to 90% of their needs  MONITOR:  PO intake, Supplement acceptance, Labs  REASON FOR ASSESSMENT:  Consult, Malnutrition Screening Tool Assessment of nutrition requirement/status  ASSESSMENT:  82 y/o male PMHx COPD, HTN, CHF, Tobacco abuse who presents with 3 days dyspnea, productive cough, chills. Patient tested positive for influenza B. No PNA.   Pt reports that he only eats 1-2 small meals daily. He says he has absolutely no appetite. He states he eats "just enough". Explained that eating just enough to survive is very far from ideal. He says he just doesn't want anything to eat. He also has suffered from progressive taste loss to the point where nothing tastes good so he has no desire to eat Family member in room states that pt gets hypoxic when eating and this further hinders his PO intake. Pt did not follow any type of diet. He used to take a mvi but stopped because he didn't see a benefit.   Rd was honest with  family and states that his lack of appetite and loss of taste are likely related to his end stage COPD and will likely not improve. Spent considerable time giving recommendations to patient to help maintain his weight. RD reccommended Ensure/Boost however stated that finances are an issue. RD went over high protein/kcal snacks/meals ie PB, eggs, meats, cheese, whole milk. Pt states that he doesn't drink water; he drinks soda, coffee and whole. RD recommended adding affordable protein powder to milk to make fortified milk. Also advised to add sugar, cream honey etc to coffee. These are easy ways to increase caloric intake.   RD gave suggestions on how to bring flavor back to food by adding sweetener/sugar to items or using marinades for meats. Elaborated that sweet/tart foods are typically the ones pt's with taste loss remain sensitive to the longest.   Pt is very weak. Pt states that he cannot go to bathroom without having to sit down in between to rest. He is not on home oxygen. Daughter thinks he needs home oxygen. Asked her to tell MD this. Patient also wants to find new pulmonologist. Assured that SW/CW can assist with this.   Pt states he has lost 30 lbs in 4-5 months. He gave a UBW as 147-148 lbs. The bed weight was ~130 lbs. Pt/family did not believe this was accurate.   Pt is certainly severely malnourished. Given complete loss of appetite, wonder if appetite stimulant would be appropriate   NFPE: Severe muscle wasting very  evident on quadriceps and in clavicle, temporal regions. Moderate fat wasting evident in tricep region. Mild fat loss orbital region.   Labs reviewed: Hyperglycemic, elevated wbc, elevated lactic acid  Diet Order:  Diet Heart Room service appropriate?: Yes; Fluid consistency:: Thin  Skin:  Reviewed, no issues  Last BM:  Unknown  Height:  Ht Readings from Last 1 Encounters:  07/21/15 5' 7"  (1.702 m)   Weight:  Wt Readings from Last 1 Encounters:  07/21/15 130 lb  11.7 oz (59.3 kg)   Ideal Body Weight:  67.27 kg  BMI:  Body mass index is 20.47 kg/(m^2).  Estimated Nutritional Needs:  Kcal:  1800-2000 kcals (30-34 kcal/kg bw) Protein:  81-94 g (1.2-1.4 g/kg bw) Fluid:  1.8-2 liters fluid  EDUCATION NEEDS:  Education needs addressed  Burtis Junes RD, LDN Nutrition Pager: [phone] 07/21/2015 1:40 PM

## 2015-07-21 NOTE — Progress Notes (Signed)
Pt. Arrived to unit rm#[id number].  Report received from [facility] ICU, RN.

## 2015-07-21 NOTE — Progress Notes (Signed)
Patient seen and examined, chart reviewed. Discussed with wife and daughter at bedside, all questions answered. Patient admitted earlier today with severe dyspnea that required noninvasive positive pressure ventilation. He has subsequently tested positive for influenza B. Chest x-ray negative for pneumonia. Has been weaned down to nasal cannula oxygen and is much improved. Will transfer to regular floor today, discontinue broad-spectrum antibiotics and start Tamiflu. Hopeful for discharge home in the next 24-48 hours.  Domingo Mend, MD [facility] Pager: [phone]

## 2015-07-22 DIAGNOSIS — J101 Influenza due to other identified influenza virus with other respiratory manifestations: Secondary | ICD-10-CM

## 2015-07-22 LAB — URINE CULTURE: CULTURE: NO GROWTH

## 2015-07-22 LAB — HIV ANTIBODY (ROUTINE TESTING W REFLEX): HIV Screen 4th Generation wRfx: NONREACTIVE

## 2015-07-22 LAB — LEGIONELLA ANTIGEN, URINE

## 2015-07-22 MED ORDER — PREDNISONE 10 MG PO TABS: 10.0000 mg | ORAL_TABLET | Freq: Every day | ORAL | Status: DC

## 2015-07-22 MED ORDER — TIOTROPIUM BROMIDE MONOHYDRATE 18 MCG IN CAPS: 18.0000 ug | ORAL_CAPSULE | Freq: Every day | RESPIRATORY_TRACT | Status: DC

## 2015-07-22 MED ORDER — ADULT MULTIVITAMIN W/MINERALS CH: 1.0000 | ORAL_TABLET | Freq: Every day | ORAL | Status: DC

## 2015-07-22 MED ORDER — DEXTROSE 5 % IV SOLN
INTRAVENOUS | Status: AC
  Filled 2015-07-22: qty 500

## 2015-07-22 MED ORDER — BUDESONIDE-FORMOTEROL FUMARATE 160-4.5 MCG/ACT IN AERO: 1.0000 | INHALATION_SPRAY | Freq: Two times a day (BID) | RESPIRATORY_TRACT | Status: AC

## 2015-07-22 MED ORDER — OSELTAMIVIR PHOSPHATE 30 MG PO CAPS: 30.0000 mg | ORAL_CAPSULE | Freq: Two times a day (BID) | ORAL | Status: DC

## 2015-07-22 NOTE — Care Management Important Message (Signed)
Important Message  Patient Details  Name: Patrick Hobbs MRN: [id number] Date of Birth: Dec 14, 1933   Medicare Important Message Given:  N/A - LOS <3 / Initial given by admissions    Alvie Heidelberg, RN 07/22/2015, 3:18 PM

## 2015-07-22 NOTE — Progress Notes (Signed)
SATURATION QUALIFICATIONS: (This note is used to comply with regulatory documentation for home oxygen)  Patient Saturations on Room Air at Rest = 94%  Patient Saturations on Room Air while Ambulating = low as 84%  Patient Saturations on 4 Liters of oxygen while Ambulating = 93-94%%  Please briefly explain why patient needs home oxygen: Patient O2 saturations drops when he is ambulating of off oxygen.

## 2015-07-22 NOTE — Discharge Summary (Signed)
Physician Discharge Summary  [facility] [id number] DOB: 07/13/1933 DOA: 07/20/2015  PCP: No primary care provider on file.  Admit date: 07/20/2015 Discharge date: 07/22/2015  Time spent: 45 minutes  Recommendations for Outpatient Follow-up:  -We be discharged home today. -Advised to follow-up with primary care provider in 2 weeks.   Discharge Diagnoses:  Principal Problem:   Influenza B Active Problems:   COPD exacerbation ([place])   Acute respiratory failure with hypoxia (HCC)   Hypertension   Sepsis ([place])   Protein-calorie malnutrition, severe   Discharge Condition: Stable and improved  Filed Weights   07/21/15 0252  Weight: 59.3 kg (130 lb 11.7 oz)    History of present illness:  As per Dr. Myna Hidalgo on 2/22Karleen Hampshire Nmn Pal[place] is a 82 y.o. male with PMH of COPD without home O2 requirement, hypertension, and ongoing tobacco abuse who presents to the ED with approximately 3 days of dyspnea, productive cough, and subjective fevers. Mr. Shatto at been in his usual state of health until approximately 3 days ago when he developed subjective fever and chills with concomitant exertional dyspnea and cough productive of thick green sputum. The symptoms continued to worsen over the ensuing days, prompting his visit to the ER tonight. Patient suffers from COPD and it seen his primary care physician just over a week ago for dyspnea and was placed on prednisone taper which he continues today. Despite this, his symptoms have progressed and he is now dyspneic at rest. He denies any chest pain or palpitations, and denies abdominal pain, nausea, vomiting, or diarrhea. Patient reports that he received his flu shot earlier in the season and has had no sick contacts. There is been no long distance travel, prolonged immobilization, or unilateral leg swelling, erythema, or tenderness. Aside from the prednisone taper and his rescue inhaler, patient has not attempted any interventions for the current  illness. He has had similar symptoms previously with COPD exacerbations.  In ED, patient was found to be in severe respiratory distress, requiring supplemental oxygen to maintain saturations in the mid 90s. He was tachypnea with rate in the 30s and tachycardic to 120. Blood pressure remained stable. EKG feature to sinus tachycardia with rate of 110 and significant background artifact that compromises further interpretation. Chest x-ray was notable for hyperinflation but there is no definite acute findings. Blood work returned with a hyponatremia and hypochloremia on chem panel, and leukocytosis to 12,500 as well as polycythemia on the CBC. Blood gas was obtained and pH is 7.2 weight with PCO2 of 61. Lactic acid returns elevated to a value of 3.38. Patient was bolused with 1 L of normal saline, vancomycin and Zosyn were administered empirically, nebulized breathing treatments were given, and a 125 mg IV Solu-Medrol was pushed in the ED. Patient required BiPAP to maintain adequate saturations and will be admitted to the stepdown unit for ongoing evaluation and management of acute respiratory failure with hypoxia suspected secondary to COPD exacerbation with infectious precipitant.  Hospital Course:   Acute hypoxemic respiratory failure -Due to influenza B on top of COPD exacerbation. -He will be discharged on chronic oxygen given his ambulating saturations in the low 80s.  Influenza B -Currently afebrile, cough improved, continue Tamiflu for 4 more days.  Sepsis -Due to influenza, sepsis parameters improved.  COPD with acute exacerbation -Likely precipitated by influenza. -We'll be discharged on a prednisone taper, Tamiflu. -History of PD regimen has been optimized to include Symbicort, Spiriva as well as when necessary albuterol.  Procedures:  None   Consultations:  None  Discharge Instructions      Discharge Instructions    Increase activity slowly    Complete by:  As directed               Medication List    STOP taking these medications        fluticasone 110 MCG/ACT inhaler  Commonly known as:  FLOVENT HFA      TAKE these medications        albuterol 108 (90 Base) MCG/ACT inhaler  Commonly known as:  PROVENTIL HFA;VENTOLIN HFA  Inhale 1-2 puffs into the lungs every 6 (six) hours as needed for wheezing or shortness of breath.     amLODipine 10 MG tablet  Commonly known as:  NORVASC  Take 10 mg by mouth daily.     budesonide-formoterol 160-4.5 MCG/ACT inhaler  Commonly known as:  SYMBICORT  Inhale 1 puff into the lungs 2 (two) times daily.     metoprolol succinate 100 MG 24 hr tablet  Commonly known as:  TOPROL-XL  Take 100 mg by mouth daily. Take with or immediately following a meal.     multivitamin with minerals Tabs tablet  Take 1 tablet by mouth daily.     NASONEX 50 MCG/ACT nasal spray  Generic drug:  mometasone  Place 1-2 sprays into both nostrils daily.     oseltamivir 30 MG capsule  Commonly known as:  TAMIFLU  Take 1 capsule (30 mg total) by mouth 2 (two) times daily.     predniSONE 10 MG tablet  Commonly known as:  DELTASONE  Take 1 tablet (10 mg total) by mouth daily with breakfast. Take 6 tablets today and then decrease by 1 tablet daily until none are left.     tiotropium 18 MCG inhalation capsule  Commonly known as:  SPIRIVA HANDIHALER  Place 1 capsule (18 mcg total) into inhaler and inhale daily.       No Known Allergies Follow-up Information    Schedule an appointment as soon as possible for a visit in 2 weeks to follow up.   Why:  With your regular physician       The results of significant diagnostics from this hospitalization (including imaging, microbiology, ancillary and laboratory) are listed below for reference.    Significant Diagnostic Studies: Dg Chest Port 1 View  07/20/2015  CLINICAL DATA:  Increasing shortness of breath since this morning. Cough and fever. EXAM: PORTABLE CHEST 1 VIEW COMPARISON:   Frontal and lateral views 09/04/2014 FINDINGS: Stable hyperinflation. There is biapical pleural parenchymal scarring. The cardiomediastinal contours are unchanged, heart normal in size. The lungs are clear. Pulmonary vasculature is normal. No consolidation, pleural effusion, or pneumothorax. No acute osseous abnormalities are seen. IMPRESSION: Hyperinflation.  No superimposed acute process. Electronically Signed   By: Jeb Levering M.D.   On: 07/20/2015 23:40    Microbiology: Recent Results (from the past 240 hour(s))  Blood Culture (routine x 2)     Status: None (Preliminary result)   Collection Time: 07/20/15 11:26 PM  Result Value Ref Range Status   Specimen Description BLOOD LEFT ANTECUBITAL  Final   Special Requests   Final    BOTTLES DRAWN AEROBIC AND ANAEROBIC AEB=10CC ANA=6CC   Culture NO GROWTH 2 DAYS  Final   Report Status PENDING  Incomplete  Blood Culture (routine x 2)     Status: None (Preliminary result)   Collection Time: 07/20/15 11:40 PM  Result Value Ref Range  Status   Specimen Description BLOOD LEFT ANTECUBITAL  Final   Special Requests   Final    BOTTLES DRAWN AEROBIC AND ANAEROBIC AEB=8CC ANA=4CC   Culture NO GROWTH 2 DAYS  Final   Report Status PENDING  Incomplete  Urine culture     Status: None   Collection Time: 07/20/15 11:49 PM  Result Value Ref Range Status   Specimen Description URINE, CLEAN CATCH  Final   Special Requests NONE  Final   Culture   Final    NO GROWTH 1 DAY Performed at [facility]    Report Status 07/22/2015 FINAL  Final  MRSA PCR Screening     Status: None   Collection Time: 07/21/15  4:58 AM  Result Value Ref Range Status   MRSA by PCR NEGATIVE NEGATIVE Final    Comment:        The GeneXpert MRSA Assay (FDA approved for NASAL specimens only), is one component of a comprehensive MRSA colonization surveillance program. It is not intended to diagnose MRSA infection nor to guide or monitor treatment for MRSA infections.       Labs: Basic Metabolic Panel:  Recent Labs Lab 07/20/15 2309  NA 133*  K 4.3  CL 93*  CO2 29  GLUCOSE 152*  BUN 27*  CREATININE 0.87  CALCIUM 9.1   Liver Function Tests:  Recent Labs Lab 07/20/15 2309  AST 25  ALT 20  ALKPHOS 59  BILITOT 0.6  PROT 8.3*  ALBUMIN 4.2   No results for input(s): LIPASE, AMYLASE in the last 168 hours. No results for input(s): AMMONIA in the last 168 hours. CBC:  Recent Labs Lab 07/20/15 2309  WBC 12.5*  NEUTROABS 9.3*  HGB 18.3*  HCT 53.0*  MCV 92.0  PLT 226   Cardiac Enzymes: No results for input(s): CKTOTAL, CKMB, CKMBINDEX, TROPONINI in the last 168 hours. BNP: BNP (last 3 results)  Recent Labs  07/20/15 2309  BNP 103.0*    ProBNP (last 3 results) No results for input(s): PROBNP in the last 8760 hours.  CBG: No results for input(s): GLUCAP in the last 168 hours.     SignedLelon Frohlich  [facility] Pager: [phone] 07/22/2015, 1:46 PM

## 2015-07-22 NOTE — Care Management Note (Signed)
Case Management Note  Patient Details  Name: Patrick Hobbs MRN: [id number] Date of Birth: 1933/06/02  Subjective/Objective:       Spoke with patient and family. Patient is from home with spouse. Has other family support. Patient does not use any DME. Qualifies for    Home O2.Marland Kitchen Requested DME provider [facility].        Action/Plan: Home with O2 Self Care. Expected Discharge Date:                  Expected Discharge Plan:  Home/Self Care  In-House Referral:  Chaplain  Discharge planning Services  CM Consult  Post Acute Care Choice:    Choice offered to:     DME Arranged:  Oxygen DME Agency:  [facility]  HH Arranged:    [place] Agency:     Status of Service:  Completed, signed off  Medicare Important Message Given:    Date Medicare IM Given:    Medicare IM give by:    Date Additional Medicare IM Given:    Additional Medicare Important Message give by:     If discussed at [place] of Stay Meetings, dates discussed:    Additional Comments:  Alvie Heidelberg, RN 07/22/2015, 1:59 PM

## 2015-07-22 NOTE — Progress Notes (Signed)
Pt discharged with IV removed and site intact. Patient discharged with all personal belongings and prescriptions.

## 2015-07-22 NOTE — Care Management (Signed)
Follow up appointmetn made for patient with Dr Leonie Douglas.  1430 on 15 Mar. Slip given to family and placed on AVM

## 2015-07-25 LAB — CULTURE, BLOOD (ROUTINE X 2)
CULTURE: NO GROWTH
CULTURE: NO GROWTH

## 2015-08-07 ENCOUNTER — Encounter (HOSPITAL_COMMUNITY): Payer: Self-pay | Admitting: Emergency Medicine

## 2015-08-07 ENCOUNTER — Inpatient Hospital Stay (HOSPITAL_COMMUNITY)
Admission: EM | Admit: 2015-08-07 | Discharge: 2015-08-16 | DRG: 190 | Disposition: A | Discharge status: Hospice | Payer: Medicare Other | Attending: Internal Medicine | Admitting: Internal Medicine

## 2015-08-07 ENCOUNTER — Emergency Department (HOSPITAL_COMMUNITY): Payer: Medicare Other

## 2015-08-07 DIAGNOSIS — J440 Chronic obstructive pulmonary disease with acute lower respiratory infection: Principal | ICD-10-CM | POA: Diagnosis present

## 2015-08-07 DIAGNOSIS — Z9981 Dependence on supplemental oxygen: Secondary | ICD-10-CM | POA: Diagnosis not present

## 2015-08-07 DIAGNOSIS — E43 Unspecified severe protein-calorie malnutrition: Secondary | ICD-10-CM | POA: Diagnosis present

## 2015-08-07 DIAGNOSIS — J9621 Acute and chronic respiratory failure with hypoxia: Secondary | ICD-10-CM | POA: Diagnosis present

## 2015-08-07 DIAGNOSIS — F17200 Nicotine dependence, unspecified, uncomplicated: Secondary | ICD-10-CM | POA: Diagnosis present

## 2015-08-07 DIAGNOSIS — Z7951 Long term (current) use of inhaled steroids: Secondary | ICD-10-CM | POA: Diagnosis not present

## 2015-08-07 DIAGNOSIS — I498 Other specified cardiac arrhythmias: Secondary | ICD-10-CM

## 2015-08-07 DIAGNOSIS — Z79899 Other long term (current) drug therapy: Secondary | ICD-10-CM

## 2015-08-07 DIAGNOSIS — Z515 Encounter for palliative care: Secondary | ICD-10-CM | POA: Insufficient documentation

## 2015-08-07 DIAGNOSIS — R0902 Hypoxemia: Secondary | ICD-10-CM

## 2015-08-07 DIAGNOSIS — J441 Chronic obstructive pulmonary disease with (acute) exacerbation: Secondary | ICD-10-CM | POA: Diagnosis present

## 2015-08-07 DIAGNOSIS — I4891 Unspecified atrial fibrillation: Secondary | ICD-10-CM | POA: Insufficient documentation

## 2015-08-07 DIAGNOSIS — F419 Anxiety disorder, unspecified: Secondary | ICD-10-CM | POA: Diagnosis present

## 2015-08-07 DIAGNOSIS — Z8249 Family history of ischemic heart disease and other diseases of the circulatory system: Secondary | ICD-10-CM | POA: Diagnosis not present

## 2015-08-07 DIAGNOSIS — J9601 Acute respiratory failure with hypoxia: Secondary | ICD-10-CM | POA: Diagnosis not present

## 2015-08-07 DIAGNOSIS — R54 Age-related physical debility: Secondary | ICD-10-CM | POA: Diagnosis present

## 2015-08-07 DIAGNOSIS — R0600 Dyspnea, unspecified: Secondary | ICD-10-CM | POA: Diagnosis not present

## 2015-08-07 DIAGNOSIS — J432 Centrilobular emphysema: Secondary | ICD-10-CM | POA: Diagnosis not present

## 2015-08-07 DIAGNOSIS — IMO0002 Reserved for concepts with insufficient information to code with codable children: Secondary | ICD-10-CM | POA: Insufficient documentation

## 2015-08-07 DIAGNOSIS — I4892 Unspecified atrial flutter: Secondary | ICD-10-CM

## 2015-08-07 DIAGNOSIS — I509 Heart failure, unspecified: Secondary | ICD-10-CM | POA: Diagnosis present

## 2015-08-07 DIAGNOSIS — J181 Lobar pneumonia, unspecified organism: Secondary | ICD-10-CM | POA: Diagnosis present

## 2015-08-07 DIAGNOSIS — E872 Acidosis: Secondary | ICD-10-CM | POA: Diagnosis present

## 2015-08-07 DIAGNOSIS — Y95 Nosocomial condition: Secondary | ICD-10-CM | POA: Diagnosis present

## 2015-08-07 DIAGNOSIS — Z888 Allergy status to other drugs, medicaments and biological substances status: Secondary | ICD-10-CM

## 2015-08-07 DIAGNOSIS — I110 Hypertensive heart disease with heart failure: Secondary | ICD-10-CM | POA: Diagnosis present

## 2015-08-07 DIAGNOSIS — J189 Pneumonia, unspecified organism: Secondary | ICD-10-CM | POA: Diagnosis not present

## 2015-08-07 DIAGNOSIS — J449 Chronic obstructive pulmonary disease, unspecified: Secondary | ICD-10-CM

## 2015-08-07 DIAGNOSIS — Z720 Tobacco use: Secondary | ICD-10-CM | POA: Diagnosis present

## 2015-08-07 DIAGNOSIS — Z66 Do not resuscitate: Secondary | ICD-10-CM | POA: Diagnosis present

## 2015-08-07 DIAGNOSIS — J9620 Acute and chronic respiratory failure, unspecified whether with hypoxia or hypercapnia: Secondary | ICD-10-CM

## 2015-08-07 DIAGNOSIS — Z7189 Other specified counseling: Secondary | ICD-10-CM | POA: Insufficient documentation

## 2015-08-07 HISTORY — DX: Dependence on supplemental oxygen: Z99.81

## 2015-08-07 HISTORY — DX: Unspecified osteoarthritis, unspecified site: M19.90

## 2015-08-07 HISTORY — DX: Depression, unspecified: F32.A

## 2015-08-07 HISTORY — DX: Anxiety disorder, unspecified: F41.9

## 2015-08-07 HISTORY — DX: Migraine, unspecified, not intractable, without status migrainosus: G43.909

## 2015-08-07 HISTORY — DX: Adverse effect of unspecified anesthetic, initial encounter: T41.45XA

## 2015-08-07 HISTORY — DX: Pneumonia, unspecified organism: J18.9

## 2015-08-07 HISTORY — DX: Other complications of anesthesia, initial encounter: T88.59XA

## 2015-08-07 HISTORY — DX: Major depressive disorder, single episode, unspecified: F32.9

## 2015-08-07 HISTORY — DX: Malignant neoplasm of prostate: C61

## 2015-08-07 LAB — I-STAT ARTERIAL BLOOD GAS, ED
ACID-BASE EXCESS: 6 mmol/L — AB (ref 0.0–2.0)
Bicarbonate: 29.5 mEq/L — ABNORMAL HIGH (ref 20.0–24.0)
O2 Saturation: 94 %
TCO2: 31 mmol/L (ref 0–100)
pCO2 arterial: 39.7 mmHg (ref 35.0–45.0)
pH, Arterial: 7.479 — ABNORMAL HIGH (ref 7.350–7.450)
pO2, Arterial: 65 mmHg — ABNORMAL LOW (ref 80.0–100.0)

## 2015-08-07 LAB — BASIC METABOLIC PANEL
ANION GAP: 16 — AB (ref 5–15)
BUN: 43 mg/dL — ABNORMAL HIGH (ref 6–20)
CHLORIDE: 93 mmol/L — AB (ref 101–111)
CO2: 27 mmol/L (ref 22–32)
Calcium: 9.1 mg/dL (ref 8.9–10.3)
Creatinine, Ser: 0.78 mg/dL (ref 0.61–1.24)
GFR calc non Af Amer: >60 mL/min (ref >60)
Glucose, Bld: 112 mg/dL — ABNORMAL HIGH (ref 65–99)
POTASSIUM: 4 mmol/L (ref 3.5–5.1)
Sodium: 136 mmol/L (ref 135–145)

## 2015-08-07 LAB — CBC WITH DIFFERENTIAL/PLATELET
BASOS PCT: 0 %
Basophils Absolute: 0 10*3/uL (ref 0.0–0.1)
EOS PCT: 0 %
Eosinophils Absolute: 0 10*3/uL (ref 0.0–0.7)
HCT: 45.8 % (ref 39.0–52.0)
HEMOGLOBIN: 15.3 g/dL (ref 13.0–17.0)
LYMPHS PCT: 6 %
Lymphs Abs: 0.6 10*3/uL — ABNORMAL LOW (ref 0.7–4.0)
MCH: 30.2 pg (ref 26.0–34.0)
MCHC: 33.4 g/dL (ref 30.0–36.0)
MCV: 90.5 fL (ref 78.0–100.0)
MONOS PCT: 5 %
Monocytes Absolute: 0.5 10*3/uL (ref 0.1–1.0)
NEUTROS PCT: 89 %
Neutro Abs: 8.1 10*3/uL — ABNORMAL HIGH (ref 1.7–7.7)
Platelets: 236 10*3/uL (ref 150–400)
RBC: 5.06 MIL/uL (ref 4.22–5.81)
RDW: 15 % (ref 11.5–15.5)
WBC MORPHOLOGY: INCREASED
WBC: 9.2 10*3/uL (ref 4.0–10.5)

## 2015-08-07 LAB — BRAIN NATRIURETIC PEPTIDE: B NATRIURETIC PEPTIDE 5: 144.2 pg/mL — AB (ref 0.0–100.0)

## 2015-08-07 LAB — I-STAT TROPONIN, ED: Troponin i, poc: 0.07 ng/mL (ref 0.00–0.08)

## 2015-08-07 LAB — I-STAT CG4 LACTIC ACID, ED: LACTIC ACID, VENOUS: 2.97 mmol/L — AB (ref 0.5–2.0)

## 2015-08-07 MED ORDER — VANCOMYCIN HCL IN DEXTROSE 1-5 GM/200ML-% IV SOLN
1000.0000 mg | Freq: Once | INTRAVENOUS | Status: AC
  Administered 2015-08-07: 1000 mg via INTRAVENOUS
  Filled 2015-08-07: qty 200

## 2015-08-07 MED ORDER — CEFEPIME HCL 1 G IJ SOLR
1.0000 g | Freq: Once | INTRAMUSCULAR | Status: AC
  Administered 2015-08-07: 1 g via INTRAVENOUS
  Filled 2015-08-07: qty 1

## 2015-08-07 MED ORDER — METHYLPREDNISOLONE SODIUM SUCC 125 MG IJ SOLR
60.0000 mg | Freq: Four times a day (QID) | INTRAMUSCULAR | Status: DC
  Administered 2015-08-07 – 2015-08-12 (×19): 60 mg via INTRAVENOUS
  Filled 2015-08-07 (×18): qty 2

## 2015-08-07 MED ORDER — TIOTROPIUM BROMIDE MONOHYDRATE 18 MCG IN CAPS
18.0000 ug | ORAL_CAPSULE | Freq: Every day | RESPIRATORY_TRACT | Status: DC
  Administered 2015-08-08 – 2015-08-10 (×3): 18 ug via RESPIRATORY_TRACT
  Filled 2015-08-07: qty 5

## 2015-08-07 MED ORDER — IPRATROPIUM-ALBUTEROL 0.5-2.5 (3) MG/3ML IN SOLN
3.0000 mL | Freq: Once | RESPIRATORY_TRACT | Status: AC
  Administered 2015-08-07: 3 mL via RESPIRATORY_TRACT
  Filled 2015-08-07: qty 3

## 2015-08-07 MED ORDER — VANCOMYCIN HCL 500 MG IV SOLR
500.0000 mg | Freq: Two times a day (BID) | INTRAVENOUS | Status: DC
  Administered 2015-08-08 (×2): 500 mg via INTRAVENOUS
  Filled 2015-08-07 (×3): qty 500

## 2015-08-07 MED ORDER — MAGNESIUM SULFATE 2 GM/50ML IV SOLN
2.0000 g | Freq: Once | INTRAVENOUS | Status: AC
  Administered 2015-08-07: 2 g via INTRAVENOUS
  Filled 2015-08-07: qty 50

## 2015-08-07 MED ORDER — MOMETASONE FURO-FORMOTEROL FUM 200-5 MCG/ACT IN AERO
2.0000 | INHALATION_SPRAY | Freq: Two times a day (BID) | RESPIRATORY_TRACT | Status: DC
  Administered 2015-08-08 – 2015-08-10 (×5): 2 via RESPIRATORY_TRACT
  Filled 2015-08-07: qty 8.8

## 2015-08-07 MED ORDER — ENOXAPARIN SODIUM 40 MG/0.4ML ~~LOC~~ SOLN
40.0000 mg | SUBCUTANEOUS | Status: DC
  Administered 2015-08-07 – 2015-08-15 (×9): 40 mg via SUBCUTANEOUS
  Filled 2015-08-07 (×9): qty 0.4

## 2015-08-07 MED ORDER — IPRATROPIUM-ALBUTEROL 0.5-2.5 (3) MG/3ML IN SOLN
3.0000 mL | RESPIRATORY_TRACT | Status: DC | PRN
  Administered 2015-08-10: 3 mL via RESPIRATORY_TRACT
  Filled 2015-08-07 (×2): qty 3

## 2015-08-07 MED ORDER — CEFEPIME HCL 1 G IJ SOLR
1.0000 g | Freq: Three times a day (TID) | INTRAMUSCULAR | Status: DC
  Administered 2015-08-07 – 2015-08-16 (×27): 1 g via INTRAVENOUS
  Filled 2015-08-07 (×28): qty 1

## 2015-08-07 MED ORDER — ALBUTEROL SULFATE (2.5 MG/3ML) 0.083% IN NEBU: 2.5000 mg | INHALATION_SOLUTION | RESPIRATORY_TRACT | Status: DC | PRN

## 2015-08-07 MED ORDER — SODIUM CHLORIDE 0.9 % IV BOLUS (SEPSIS)
1000.0000 mL | Freq: Once | INTRAVENOUS | Status: AC
  Administered 2015-08-07: 1000 mL via INTRAVENOUS

## 2015-08-07 MED ORDER — SODIUM CHLORIDE 0.9 % IV SOLN
INTRAVENOUS | Status: DC
  Administered 2015-08-07 – 2015-08-08 (×2): via INTRAVENOUS

## 2015-08-07 MED ORDER — METHYLPREDNISOLONE SODIUM SUCC 125 MG IJ SOLR
125.0000 mg | Freq: Once | INTRAMUSCULAR | Status: AC
  Administered 2015-08-07: 125 mg via INTRAVENOUS
  Filled 2015-08-07: qty 2

## 2015-08-07 NOTE — ED Notes (Signed)
Patient placed back on NRB per MD little 02 states 83.patient currently 87% on NRB

## 2015-08-07 NOTE — H&P (Signed)
[facility] History and Physical  Breydan Cova [id number] DOB: [birth year] DOA: 08/07/2015  Referring physician: er PCP: [facility]   Chief Complaint: SOB  HPI: Patrick Hobbs is a 82 y.o. male  With PMHx of COPD- 3-4L O2 dependant, CHF (unknown kind), and current tobacco use (1 cigarette per day).  He was recently seen and d/c'd from [facility] after having the flu/COPD exacerbation.  Per wife, patient did "okay" upon discharge.  He has not felt well for about 3 months.  Grandson said he did better while on the steroid taper.  He comes in today via EMS from [facility] with c/o fatigue, weakness, and worsening SOB.  He was hypoxic when EMS arrived on his 4L O2 into the 80s.  He was wheezing at that time as well.  He was given albuterol by EMS  He also has right sided chest pain, worse with deep breathing.   Patient denied fever/chills.  Family says he has not been eating well at all.  In the ER, x ray showed a patchy right lower lobe PNA.  His lactic acid was elevated.  His BNP is elevated from prior admission although patient looks clinically dry.  Pateint was placed on Bipap in the ER to increase his O2 sats.  He did endorse being a DNR but Bipap was ok. Hospitalist were consulted for admission to SDU   Review of Systems:  Unable to do full ROS as patient on Bipap and difficult to understand    Past Medical History  Diagnosis Date  . COPD (chronic obstructive pulmonary disease) ([place])   . Hypertension   . CHF (congestive heart failure) ([place])   . Tobacco use   . Prostate neoplasm    No past surgical history on file. Social History:  reports that he has been smoking.  He does not have any smokeless tobacco history on file. He reports that he does not drink alcohol or use illicit drugs.  Allergies  Allergen Reactions  . Flovent Diskus [Fluticasone Propionate (Inhal)] Other (See Comments)    headache    Family Hx: HTN  Prior to  Admission medications   Medication Sig Start Date End Date Taking? Authorizing Provider  albuterol (PROVENTIL HFA;VENTOLIN HFA) 108 (90 Base) MCG/ACT inhaler Inhale 1-2 puffs into the lungs every 6 (six) hours as needed for wheezing or shortness of breath.    Historical Provider, MD  amLODipine (NORVASC) 10 MG tablet Take 10 mg by mouth daily.    Historical Provider, MD  budesonide-formoterol (SYMBICORT) 160-4.5 MCG/ACT inhaler Inhale 1 puff into the lungs 2 (two) times daily. 07/22/15   Erline Hau, MD  metoprolol succinate (TOPROL-XL) 100 MG 24 hr tablet Take 100 mg by mouth daily. Take with or immediately following a meal.    Historical Provider, MD  Multiple Vitamin (MULTIVITAMIN WITH MINERALS) TABS tablet Take 1 tablet by mouth daily. 07/22/15   Erline Hau, MD  NASONEX 50 MCG/ACT nasal spray Place 1-2 sprays into both nostrils daily. 07/15/15   Historical Provider, MD  oseltamivir (TAMIFLU) 30 MG capsule Take 1 capsule (30 mg total) by mouth 2 (two) times daily. 07/22/15   Erline Hau, MD  predniSONE (DELTASONE) 10 MG tablet Take 1 tablet (10 mg total) by mouth daily with breakfast. Take 6 tablets today and then decrease by 1 tablet daily until none are left. 07/22/15   Erline Hau, MD  tiotropium (SPIRIVA HANDIHALER) 18 MCG  inhalation capsule Place 1 capsule (18 mcg total) into inhaler and inhale daily. 07/22/15   Erline Hau, MD   Physical Exam: Filed Vitals:   08/07/15 1419 08/07/15 1445  BP: 132/62 120/68  Pulse: 88 96  Temp: 97.9 F (36.6 C)   TempSrc: Oral   Resp: 21 27  SpO2: 84% 81%    Wt Readings from Last 3 Encounters:  07/21/15 59.3 kg (130 lb 11.7 oz)    General:  Ill appearing- thin male, ruddy complexion, Bipap Eyes: PERRL, normal lids, irises & conjunctiva ENT: mucous membranes dry Neck: no LAD, masses or thyromegaly Cardiovascular: RRR, no m/r/g. No LE edema. Telemetry: SR, no arrhythmias    Respiratory: increased work of breathing, right rales, no wheezing Abdomen: soft, ntnd, thin Skin: ruddy complexion Musculoskeletal: grossly normal tone BUE/BLE Psychiatric: grossly normal mood and affect, speech fluent and appropriate Neurologic: grossly non-focal.          Labs on Admission:  Basic Metabolic Panel:  Recent Labs Lab 08/07/15 1443  NA 136  K 4.0  CL 93*  CO2 27  GLUCOSE 112*  BUN 43*  CREATININE 0.78  CALCIUM 9.1   Liver Function Tests: No results for input(s): AST, ALT, ALKPHOS, BILITOT, PROT, ALBUMIN in the last 168 hours. No results for input(s): LIPASE, AMYLASE in the last 168 hours. No results for input(s): AMMONIA in the last 168 hours. CBC:  Recent Labs Lab 08/07/15 1443  WBC 9.2  NEUTROABS 8.1*  HGB 15.3  HCT 45.8  MCV 90.5  PLT 236   Cardiac Enzymes: No results for input(s): CKTOTAL, CKMB, CKMBINDEX, TROPONINI in the last 168 hours.  BNP (last 3 results)  Recent Labs  07/20/15 2309 08/07/15 1443  BNP 103.0* 144.2*    ProBNP (last 3 results) No results for input(s): PROBNP in the last 8760 hours.  CBG: No results for input(s): GLUCAP in the last 168 hours.  Radiological Exams on Admission: Dg Chest Portable 1 View  08/07/2015  CLINICAL DATA:  82 year old male with progressive shortness of breath EXAM: PORTABLE CHEST 1 VIEW COMPARISON:  Prior chest x-ray 07/20/2015 FINDINGS: New patchy airspace opacity throughout the right lower lobe with thickening along the minor fissure. The cardiac and mediastinal contours are within normal limits. The left lung remains relatively well aerated. Background of emphysema and chronic bronchitic changes is similar compared to prior. No pleural effusion or pneumothorax. No acute osseous abnormality. IMPRESSION: Interval development of patchy right lower lobe airspace opacity concerning for bronchopneumonia. Electronically Signed   By: Jacqulynn Cadet M.D.   On: 08/07/2015 15:23    EKG:  Independently reviewed. Atrial flutter--- upon exam was in nsr  Assessment/Plan Active Problems:   Tobacco abuse   Protein-calorie malnutrition, severe   HCAP (healthcare-associated pneumonia)   Pneumonia   Acute on chronic respiratory failure (HCC)   Flutter-fibrillation   HCAP -no sign of aspiration -recent hospitalization -IV abx- cefepime/vanc  Acute on chronic resp failure -wears 3-4 L but now on Bipap  Protein calorie malnutrition -encourage Po intake once off Bipap -may need palliative care for symptom management  Fib/flutter -was in this rhythm on EKG -now sinus during my exam -most likely from stress of hypoxia  H/o CHF -no echo on file- 2D echo -gentle IVF as patient appears dry  Tobacco abuse 1 cig/day -encourage cessation  COPD exacerbation abx Steroids -may benefit from pulm consult as never seen before-- inhalers not working like in previous times   Code Status: DNR DVT Prophylaxis:  Family Communication: family at bedside Disposition Plan:   Time spent: 77 min  [facility] Pager [phone]

## 2015-08-07 NOTE — ED Provider Notes (Addendum)
CSN: [id number]     Arrival date & time 08/07/15  1412 History   First MD Initiated Contact with Patient 08/07/15 1417     Chief Complaint  Patient presents with  . Shortness of Breath     (Consider location/radiation/quality/duration/timing/severity/associated sxs/prior Treatment) HPI Comments: 82yo M w/ PMH including COPD on 3L home O2, CHF, HTN who p/w shortness of breath. Pt has been feeling short of breath and weak for ~3 mo; he briefly felt better after recent hospital admission but his symptoms have returned. He endorses fatigue, weakness, and SOB that are gradually worsening. Family notes he has been difficult to get up and he has been sleeping more. He has had poor PO intake due to dyspnea. No fevers, chills. Mild diarrhea that is resolved. He endorses associated R-sided chest pain that is intermittent and worse w/ inspiration. EMS noted wheezing and gave albuterol.   Patient is a 82 y.o. male presenting with shortness of breath. The history is provided by the patient.  Shortness of Breath   Past Medical History  Diagnosis Date  . COPD (chronic obstructive pulmonary disease) ([place])   . Hypertension   . CHF (congestive heart failure) ([place])   . Tobacco use   . Prostate neoplasm    No past surgical history on file. No family history on file. Social History  Substance Use Topics  . Smoking status: Current Every Day Smoker  . Smokeless tobacco: None  . Alcohol Use: No    Review of Systems  Respiratory: Positive for shortness of breath.    10 Systems reviewed and are negative for acute change except as noted in the HPI.    Allergies  Review of patient's allergies indicates no known allergies.  Home Medications   Prior to Admission medications   Medication Sig Start Date End Date Taking? Authorizing Provider  albuterol (PROVENTIL HFA;VENTOLIN HFA) 108 (90 Base) MCG/ACT inhaler Inhale 1-2 puffs into the lungs every 6 (six) hours as needed for wheezing or shortness of  breath.    Historical Provider, MD  amLODipine (NORVASC) 10 MG tablet Take 10 mg by mouth daily.    Historical Provider, MD  budesonide-formoterol (SYMBICORT) 160-4.5 MCG/ACT inhaler Inhale 1 puff into the lungs 2 (two) times daily. 07/22/15   Erline Hau, MD  metoprolol succinate (TOPROL-XL) 100 MG 24 hr tablet Take 100 mg by mouth daily. Take with or immediately following a meal.    Historical Provider, MD  Multiple Vitamin (MULTIVITAMIN WITH MINERALS) TABS tablet Take 1 tablet by mouth daily. 07/22/15   Erline Hau, MD  NASONEX 50 MCG/ACT nasal spray Place 1-2 sprays into both nostrils daily. 07/15/15   Historical Provider, MD  oseltamivir (TAMIFLU) 30 MG capsule Take 1 capsule (30 mg total) by mouth 2 (two) times daily. 07/22/15   Erline Hau, MD  predniSONE (DELTASONE) 10 MG tablet Take 1 tablet (10 mg total) by mouth daily with breakfast. Take 6 tablets today and then decrease by 1 tablet daily until none are left. 07/22/15   Erline Hau, MD  tiotropium (SPIRIVA HANDIHALER) 18 MCG inhalation capsule Place 1 capsule (18 mcg total) into inhaler and inhale daily. 07/22/15   Erline Hau, MD   BP 120/68 mmHg  Pulse 96  Temp(Src) 97.9 F (36.6 C) (Oral)  Resp 27  SpO2 81% Physical Exam  Constitutional: He is oriented to person, place, and time. No distress.  Thin, frail, chronically ill appearing elderly man  HENT:  Head: Normocephalic and atraumatic.  dry mucous membranes  Eyes: Conjunctivae are normal. Pupils are equal, round, and reactive to light.  Neck: Neck supple.  Cardiovascular: Normal rate, regular rhythm and normal heart sounds.   No murmur heard. Pulmonary/Chest:  Diminished BS b/l, coarse b/l, occasional cough  Abdominal: Soft. Bowel sounds are normal. He exhibits no distension. There is no tenderness.  Musculoskeletal: He exhibits no edema.  Neurological: He is alert and oriented to person, place, and time.   Fluent speech  Skin: Skin is warm and dry.  Psychiatric: He has a normal mood and affect. Judgment normal.  Nursing note and vitals reviewed.   ED Course  .Critical Care Performed by: Sharlett Iles Authorized by: Sharlett Iles Total critical care time: 30 minutes Critical care time was exclusive of separately billable procedures and treating other patients. Critical care was necessary to treat or prevent imminent or life-threatening deterioration of the following conditions: respiratory failure. Critical care was time spent personally by me on the following activities: development of treatment plan with patient or surrogate, evaluation of patient's response to treatment, examination of patient, obtaining history from patient or surrogate, ordering and performing treatments and interventions, ordering and review of laboratory studies, ordering and review of radiographic studies, re-evaluation of patient's condition, review of old charts and pulse oximetry.   (including critical care time) Labs Review Labs Reviewed  BASIC METABOLIC PANEL - Abnormal; Notable for the following:    Chloride 93 (*)    Glucose, Bld 112 (*)    BUN 43 (*)    Anion gap 16 (*)    All other components within normal limits  CBC WITH DIFFERENTIAL/PLATELET - Abnormal; Notable for the following:    Neutro Abs 8.1 (*)    Lymphs Abs 0.6 (*)    All other components within normal limits  BRAIN NATRIURETIC PEPTIDE - Abnormal; Notable for the following:    B Natriuretic Peptide 144.2 (*)    All other components within normal limits  I-STAT CG4 LACTIC ACID, ED - Abnormal; Notable for the following:    Lactic Acid, Venous 2.97 (*)    All other components within normal limits  I-STAT ARTERIAL BLOOD GAS, ED - Abnormal; Notable for the following:    pH, Arterial 7.479 (*)    pO2, Arterial 65.0 (*)    Bicarbonate 29.5 (*)    Acid-Base Excess 6.0 (*)    All other components within normal limits  URINE  CULTURE  CULTURE, BLOOD (ROUTINE X 2)  CULTURE, BLOOD (ROUTINE X 2)  BLOOD GAS, ARTERIAL  I-STAT TROPOININ, ED    Imaging Review Dg Chest Portable 1 View  08/07/2015  CLINICAL DATA:  82 year old male with progressive shortness of breath EXAM: PORTABLE CHEST 1 VIEW COMPARISON:  Prior chest x-ray 07/20/2015 FINDINGS: New patchy airspace opacity throughout the right lower lobe with thickening along the minor fissure. The cardiac and mediastinal contours are within normal limits. The left lung remains relatively well aerated. Background of emphysema and chronic bronchitic changes is similar compared to prior. No pleural effusion or pneumothorax. No acute osseous abnormality. IMPRESSION: Interval development of patchy right lower lobe airspace opacity concerning for bronchopneumonia. Electronically Signed   By: Jacqulynn Cadet M.D.   On: 08/07/2015 15:23   I have personally reviewed and evaluated these images and lab results as part of my medical decision-making.   EKG Interpretation   Date/Time:  Saturday August 07 2015 14:40:52 EST Ventricular Rate:  85 PR Interval:  QRS Duration: 132 QT Interval:  345 QTC Calculation: 410 R Axis:   47 Text Interpretation:  Atrial flutter with varied AV block, Nonspecific  intraventricular conduction delay Baseline wander in lead(s) II III aVF V3  V5 Atrial flutter new from previous Confirmed by Mairany Bruno MD, Masayoshi Couzens [phone])  on 08/07/2015 2:53:11 PM     Medications  ceFEPIme (MAXIPIME) 1 g in dextrose 5 % 50 mL IVPB (1 g Intravenous New Bag/Given 08/07/15 1626)  vancomycin (VANCOCIN) IVPB 1000 mg/200 mL premix (not administered)  ceFEPIme (MAXIPIME) 1 g in dextrose 5 % 50 mL IVPB (not administered)  vancomycin (VANCOCIN) 500 mg in sodium chloride 0.9 % 100 mL IVPB (not administered)  ipratropium-albuterol (DUONEB) 0.5-2.5 (3) MG/3ML nebulizer solution 3 mL (3 mLs Nebulization Given 08/07/15 1605)  methylPREDNISolone sodium succinate (SOLU-MEDROL) 125  mg/2 mL injection 125 mg (125 mg Intravenous Given 08/07/15 1453)  magnesium sulfate IVPB 2 g 50 mL (0 g Intravenous Stopped 08/07/15 1553)  sodium chloride 0.9 % bolus 1,000 mL (1,000 mLs Intravenous New Bag/Given 08/07/15 1604)    MDM   Final diagnoses:  Hypoxia  HCAP (healthcare-associated pneumonia)   Patient with COPD on home oxygen as well as CHF presents with gradually worsening shortness of breath associated with fatigue. He was brought in by EMS and noted to be hypoxic with increased oxygen requirement, 94% on 4 L. He received albuterol in transport. On exam, he was chronically ill-appearing with mildly increased work of breathing, diminished breath sounds bilaterally. He was afebrile and the rest of his vital signs were normal. Obtained chest x-ray as well as above labwork including troponin, BNP, and lactate. Initial lactate was elevated at 2.97. Gave the patient a DuoNeb as well as magnesium and Solu-Medrol.  EKG shows atrial flutter, patient denies any history of the same. Labs showed ABG with pH of 7.48, CO2 39. Normal creatinine and normal CBC. BNP 144. Chest x-ray shows right lower lobe opacities suggestive of pneumonia. Given the patient's recent hospitalization, treated for HCAP with vancomycin and cefepime. On reexamination, he remains mildly hypoxic on nonrebreather with increased work of breathing. I have ordered patient to be placed on BiPAP for improved oxygenation as well as repeat DuoNeb. Discussed admission with [facility] hospitalist, Dr. Eliseo Squires, and patient admitted to stepdown for further care.   Sharlett Iles, MD 08/07/15 [place], MD 08/07/15 [phone]

## 2015-08-07 NOTE — ED Notes (Signed)
[address] aware of patients 02. Patient 90% 4L

## 2015-08-07 NOTE — ED Notes (Signed)
Patient comes from home states SOB started today. Per EMS fire heard wheezing throughout and gave patient 2 puffs of Albuterol. EMS states patient fell 2 days ago and now has pain to the right arm. Vitals with EMS 118/68 HR 84 94% 4L patient wears 2L baseline.

## 2015-08-07 NOTE — Progress Notes (Signed)
Pharmacy Antibiotic Note Patrick Hobbs is a 82 y.o. male admitted on 08/07/2015 with worsening SOB in setting of COPD and recent history of influenza B for which he received treatment. Pharmacy has been consulted for Cefepime and vancomycin dosing for potential bronchopneumonia.  Plan: 1. Vancomycin 500 mg Q12 hours starting 3/12 am 2. If continued will obtain VT at [facility]; goal 15 - 20 3. Cefepime 1 gram Q8H 4. F/u cx data, clinical response and other pertinent labs/levels and narrow abx as feasible 5. Following along with you daily     Temp (24hrs), Avg:97.9 F (36.6 C), Min:97.9 F (36.6 C), Max:97.9 F (36.6 C)   Recent Labs Lab 08/07/15 1443 08/07/15 1455  WBC 9.2  --   CREATININE 0.78  --   LATICACIDVEN  --  2.97*       No Known Allergies  Antimicrobials this admission: 3/11 Cefepime >> 3/11 Vancomycin >>  Dose adjustments this admission: n/a  Microbiology results: px  Thank you for allowing pharmacy to be a part of this patient's care.  Vincenza Hews, PharmD, BCPS 08/07/2015, 4:25 PM Pager: [phone]

## 2015-08-07 NOTE — Plan of Care (Signed)
Problem: ICU Phase Progression Outcomes Goal: Flu/PneumoVaccines if indicated Outcome: Completed/Met Date Met:  08/07/15 PTA

## 2015-08-08 DIAGNOSIS — J9621 Acute and chronic respiratory failure with hypoxia: Secondary | ICD-10-CM

## 2015-08-08 LAB — BASIC METABOLIC PANEL
Anion gap: 12 (ref 5–15)
BUN: 41 mg/dL — ABNORMAL HIGH (ref 6–20)
CALCIUM: 8.7 mg/dL — AB (ref 8.9–10.3)
CO2: 28 mmol/L (ref 22–32)
CREATININE: 0.58 mg/dL — AB (ref 0.61–1.24)
Chloride: 99 mmol/L — ABNORMAL LOW (ref 101–111)
GFR calc non Af Amer: >60 mL/min (ref >60)
Glucose, Bld: 180 mg/dL — ABNORMAL HIGH (ref 65–99)
Potassium: 4.2 mmol/L (ref 3.5–5.1)
SODIUM: 139 mmol/L (ref 135–145)

## 2015-08-08 LAB — LACTIC ACID, PLASMA: LACTIC ACID, VENOUS: 1.3 mmol/L (ref 0.5–2.0)

## 2015-08-08 LAB — CBC
HCT: 42.9 % (ref 39.0–52.0)
Hemoglobin: 13.9 g/dL (ref 13.0–17.0)
MCH: 29.4 pg (ref 26.0–34.0)
MCHC: 32.4 g/dL (ref 30.0–36.0)
MCV: 90.9 fL (ref 78.0–100.0)
PLATELETS: 233 10*3/uL (ref 150–400)
RBC: 4.72 MIL/uL (ref 4.22–5.81)
RDW: 15.2 % (ref 11.5–15.5)
WBC: 7.3 10*3/uL (ref 4.0–10.5)

## 2015-08-08 NOTE — Progress Notes (Signed)
Pt tolerating BIPAP. Desats to high 80's when receiving mouth care.

## 2015-08-08 NOTE — Progress Notes (Signed)
[facility] PROGRESS NOTE  Patrick Hobbs [id number] DOB: 10/05/1933 DOA: 08/07/2015 PCP: [facility]  Assessment/Plan: Active Problems:    Pneumonia - We'll continue current antibiotic regimen. - Continue supportive therapy. Lactic acidosis has resolved    Acute on chronic respiratory failure (HCC) - Secondary to principal problem list above    Flutter-fibrillation - Was most likely secondary to infectious etiology. Sinus on my exam  Tobacco abuse -We'll encourage tobacco cessation  Code Status: DO NOT RESUSCITATE Family Communication: Discussed with patient and family at bedside Disposition Plan: Pending improvement in respiratory condition   Consultants:  None  Procedures:  None  Antibiotics:  Cefepime  Vancomycin  HPI/Subjective: Pt has no new complaints. No acute issues overnight. SOB is improving compared to initial presentation  Objective: Filed Vitals:   08/08/15 0500 08/08/15 0830  BP: 107/67 115/66  Pulse: 68 103  Temp:    Resp: 22     Intake/Output Summary (Last 24 hours) at 08/08/15 1235 Last data filed at 08/08/15 0600  Gross per 24 hour  Intake    860 ml  Output    675 ml  Net    185 ml   Filed Weights   08/08/15 0630  Weight: 54.1 kg (119 lb 4.3 oz)    Exam:   General:  Pt in nad, alert and awake  Cardiovascular: rrr, no rubs  Respiratory: rhales, no wheezes, equal chest rise  Abdomen: soft, nd, nt  Musculoskeletal: no cyanosis or clubbing   Data Reviewed: Basic Metabolic Panel:  Recent Labs Lab 08/07/15 1443 08/08/15 0701  NA 136 139  K 4.0 4.2  CL 93* 99*  CO2 27 28  GLUCOSE 112* 180*  BUN 43* 41*  CREATININE 0.78 0.58*  CALCIUM 9.1 8.7*   Liver Function Tests: No results for input(s): AST, ALT, ALKPHOS, BILITOT, PROT, ALBUMIN in the last 168 hours. No results for input(s): LIPASE, AMYLASE in the last 168 hours. No results for input(s): AMMONIA in the last 168  hours. CBC:  Recent Labs Lab 08/07/15 1443 08/08/15 0701  WBC 9.2 7.3  NEUTROABS 8.1*  --   HGB 15.3 13.9  HCT 45.8 42.9  MCV 90.5 90.9  PLT 236 233   Cardiac Enzymes: No results for input(s): CKTOTAL, CKMB, CKMBINDEX, TROPONINI in the last 168 hours. BNP (last 3 results)  Recent Labs  07/20/15 2309 08/07/15 1443  BNP 103.0* 144.2*    ProBNP (last 3 results) No results for input(s): PROBNP in the last 8760 hours.  CBG: No results for input(s): GLUCAP in the last 168 hours.  No results found for this or any previous visit (from the past 240 hour(s)).   Studies: Dg Chest Portable 1 View  08/07/2015  CLINICAL DATA:  82 year old male with progressive shortness of breath EXAM: PORTABLE CHEST 1 VIEW COMPARISON:  Prior chest x-ray 07/20/2015 FINDINGS: New patchy airspace opacity throughout the right lower lobe with thickening along the minor fissure. The cardiac and mediastinal contours are within normal limits. The left lung remains relatively well aerated. Background of emphysema and chronic bronchitic changes is similar compared to prior. No pleural effusion or pneumothorax. No acute osseous abnormality. IMPRESSION: Interval development of patchy right lower lobe airspace opacity concerning for bronchopneumonia. Electronically Signed   By: Jacqulynn Cadet M.D.   On: 08/07/2015 15:23    Scheduled Meds: . ceFEPime (MAXIPIME) IV  1 g Intravenous 3 times per day  . enoxaparin (LOVENOX) injection  40 mg Subcutaneous Q24H  .  methylPREDNISolone (SOLU-MEDROL) injection  60 mg Intravenous Q6H  . mometasone-formoterol  2 puff Inhalation BID  . tiotropium  18 mcg Inhalation Daily  . vancomycin  500 mg Intravenous Q12H   Continuous Infusions: . sodium chloride 75 mL/hr at 08/08/15 0400   Time spent: > 35 minutes  Velvet Bathe  [facility] Pager [phone] If 7PM-7AM, please contact night-coverage at [email or web address], password [facility] 08/08/2015, 12:35 PM  LOS: 1 day

## 2015-08-09 ENCOUNTER — Encounter (HOSPITAL_COMMUNITY): Payer: Self-pay | Admitting: General Practice

## 2015-08-09 ENCOUNTER — Ambulatory Visit (HOSPITAL_COMMUNITY): Payer: Medicare Other

## 2015-08-09 DIAGNOSIS — J189 Pneumonia, unspecified organism: Secondary | ICD-10-CM

## 2015-08-09 DIAGNOSIS — R0600 Dyspnea, unspecified: Secondary | ICD-10-CM

## 2015-08-09 HISTORY — DX: Pneumonia, unspecified organism: J18.9

## 2015-08-09 LAB — VANCOMYCIN, TROUGH: Vancomycin Tr: 7 ug/mL — ABNORMAL LOW (ref 10.0–20.0)

## 2015-08-09 LAB — ECHOCARDIOGRAM COMPLETE: WEIGHTICAEL: 1908.3 [oz_av]

## 2015-08-09 MED ORDER — ENSURE ENLIVE PO LIQD
237.0000 mL | Freq: Three times a day (TID) | ORAL | Status: DC
  Administered 2015-08-09 – 2015-08-15 (×8): 237 mL via ORAL

## 2015-08-09 MED ORDER — LORAZEPAM 0.5 MG PO TABS
0.5000 mg | ORAL_TABLET | Freq: Four times a day (QID) | ORAL | Status: DC | PRN
  Administered 2015-08-09 – 2015-08-16 (×10): 0.5 mg via ORAL
  Filled 2015-08-09 (×10): qty 1

## 2015-08-09 MED ORDER — VANCOMYCIN HCL IN DEXTROSE 1-5 GM/200ML-% IV SOLN
1000.0000 mg | Freq: Two times a day (BID) | INTRAVENOUS | Status: DC
  Administered 2015-08-09 – 2015-08-13 (×9): 1000 mg via INTRAVENOUS
  Filled 2015-08-09 (×10): qty 200

## 2015-08-09 MED ORDER — ENSURE ENLIVE PO LIQD: 237.0000 mL | Freq: Two times a day (BID) | ORAL | Status: DC

## 2015-08-09 MED ORDER — ADULT MULTIVITAMIN W/MINERALS CH
1.0000 | ORAL_TABLET | Freq: Every day | ORAL | Status: DC
Start: 2015-08-09 — End: 2015-08-16
  Administered 2015-08-09 – 2015-08-16 (×8): 1 via ORAL
  Filled 2015-08-09 (×8): qty 1

## 2015-08-09 NOTE — Progress Notes (Addendum)
Initial Nutrition Assessment  DOCUMENTATION CODES:   Severe malnutrition in context of chronic illness  INTERVENTION:    Recommend liberalize diet to regular, patient is eating < 25% of meals.   Chocolate Ensure Enlive PO TID, each supplement provides 350 kcal and 20 grams of protein  Multivitamin daily  NUTRITION DIAGNOSIS:   Malnutrition related to chronic illness as evidenced by severe depletion of muscle mass, percent weight loss (18% weight loss within the past 3 months).  GOAL:   Patient will meet greater than or equal to 90% of their needs  MONITOR:   PO intake, Supplement acceptance, Labs, Weight trends, I & O's  REASON FOR ASSESSMENT:   Malnutrition Screening Tool    ASSESSMENT:   82 year old male with PMHx of COPD- 3-4L O2 dependant, CHF (unknown kind), and current tobacco use (1 cigarette per day). He was recently seen and d/c'd from [facility] after having the flu/COPD exacerbation. He has not felt well for about 3 months. He was admitted on 3/11 via EMS from [facility] with c/o fatigue, weakness, and worsening SOB. He was hypoxic when EMS arrived on his 4L O2 into the 80s. He was wheezing at that time as well.  Patient reports poor intake related to poor appetite and difficulty breathing for the past 3 months. Nutrition-Focused physical exam completed. Findings are moderate fat depletion, severe muscle depletion, and no edema. Patient has lost 18% of his usual weight over the past 3 months. Patient with severe PCM. He does not want anything sweet, tried and dislikes Boost Breeze supplement. Per family, he has had chocolate Ensure and likes that the best.   Diet Order:  Diet Heart Room service appropriate?: Yes; Fluid consistency:: Thin  Skin:  Reviewed, no issues  Last BM:  3/9  Height:   Ht Readings from Last 1 Encounters:  07/21/15 5\' 7"  (1.702 m)    Weight:   Wt Readings from Last 1 Encounters:  08/08/15 119 lb 4.3 oz (54.1 kg)     Ideal Body Weight:  67.3 kg  BMI:  Body mass index is 18.68 kg/(m^2).  Estimated Nutritional Needs:   Kcal:  1800-2000  Protein:  85-100 gm  Fluid:  1.8 L  EDUCATION NEEDS:   No education needs identified at this time  Molli Barrows, [place], [place], [place] Pager [phone] After Hours Pager [phone]

## 2015-08-09 NOTE — Progress Notes (Signed)
Echocardiogram 2D Echocardiogram has been performed.  Patrick Hobbs M 08/09/2015, 1:54 PM

## 2015-08-09 NOTE — Progress Notes (Signed)
Pharmacy Antibiotic Note Patrick Hobbs is a 82 y.o. male admitted on 08/07/2015 with worsening SOB in setting of COPD and recent history of influenza B for which he received treatment. Pt on Cefepime and vancomycin (Day #3) for pneumonia.  Vancomycin trough 7 mcg/ml (SUBtherapeutic for goal of 15-20 mcg/ml) on 500mg  IV q12h.  Plan: 1. Change Vancomycin to 1000 mg Q12 hours 2. Continue Cefepime 1 gram Q8H 3. F/u cx data, clinical response and other pertinent labs/levels and narrow abx as feasible 4. Vanc trough at Css on new dose  Weight: 119 lb 4.3 oz (54.1 kg)  Temp (24hrs), Avg:97.6 F (36.4 C), Min:97.5 F (36.4 C), Max:97.6 F (36.4 C)   Recent Labs Lab 08/07/15 1443 08/07/15 1455 08/08/15 0701 08/09/15 0412  WBC 9.2  --  7.3  --   CREATININE 0.78  --  0.58*  --   LATICACIDVEN  --  2.97* 1.3  --   VANCOTROUGH  --   --   --  7*       Allergies  Allergen Reactions  . Flovent Diskus [Fluticasone Propionate (Inhal)] Other (See Comments)    headache    Antimicrobials this admission: 3/11 Cefepime >> 3/11 Vancomycin >>  Dose adjustments this admission: n/a  Microbiology results: 3/11 BCx x 4: ngtd  Thank you for allowing pharmacy to be a part of this patient's care.  Sherlon Handing, PharmD, BCPS Clinical pharmacist, pager [phone] 08/09/2015 5:34 AM

## 2015-08-09 NOTE — Progress Notes (Signed)
[facility] PROGRESS NOTE  Patrick Hobbs [id number] DOB: [birth year] DOA: 08/07/2015 PCP: [facility]  Brief Narrative 82 y/o who presented with SOB and diagnosed with PNA  Assessment/Plan: Active Problems:    Pneumonia - We'll continue current antibiotic regimen. - Continue supportive therapy. Lactic acidosis has resolved    Acute on chronic respiratory failure (HCC) - Secondary to principal problem list above    Flutter-fibrillation - Was most likely secondary to infectious etiology. Sinus on my exam  Tobacco abuse -We'll encourage tobacco cessation  Anxiety - new problem will start on low dose lorazepam  Code Status: DO NOT RESUSCITATE Family Communication: Discussed with patient and family at bedside Disposition Plan: Pending improvement in respiratory condition   Consultants:  None  Procedures:  None  Antibiotics:  Cefepime  Vancomycin  HPI/Subjective: Pt reports improvement in condition. C/o some anxiety  Objective: Filed Vitals:   08/09/15 0340 08/09/15 0820  BP: 126/95   Pulse: 62   Temp: 97.5 F (36.4 C) 97.5 F (36.4 C)  Resp:      Intake/Output Summary (Last 24 hours) at 08/09/15 1703 Last data filed at 08/09/15 1633  Gross per 24 hour  Intake      0 ml  Output    800 ml  Net   -800 ml   Filed Weights   08/08/15 0630  Weight: 54.1 kg (119 lb 4.3 oz)    Exam:   General:  Pt in nad, alert and awake  Cardiovascular: rrr, no rubs  Respiratory: rhales, no wheezes, equal chest rise  Abdomen: soft, nd, nt  Musculoskeletal: no cyanosis or clubbing   Data Reviewed: Basic Metabolic Panel:  Recent Labs Lab 08/07/15 1443 08/08/15 0701  NA 136 139  K 4.0 4.2  CL 93* 99*  CO2 27 28  GLUCOSE 112* 180*  BUN 43* 41*  CREATININE 0.78 0.58*  CALCIUM 9.1 8.7*   Liver Function Tests: No results for input(s): AST, ALT, ALKPHOS, BILITOT, PROT, ALBUMIN in the last 168 hours. No results for  input(s): LIPASE, AMYLASE in the last 168 hours. No results for input(s): AMMONIA in the last 168 hours. CBC:  Recent Labs Lab 08/07/15 1443 08/08/15 0701  WBC 9.2 7.3  NEUTROABS 8.1*  --   HGB 15.3 13.9  HCT 45.8 42.9  MCV 90.5 90.9  PLT 236 233   Cardiac Enzymes: No results for input(s): CKTOTAL, CKMB, CKMBINDEX, TROPONINI in the last 168 hours. BNP (last 3 results)  Recent Labs  07/20/15 2309 08/07/15 1443  BNP 103.0* 144.2*    ProBNP (last 3 results) No results for input(s): PROBNP in the last 8760 hours.  CBG: No results for input(s): GLUCAP in the last 168 hours.  Recent Results (from the past 240 hour(s))  Culture, blood (routine x 2)     Status: None (Preliminary result)   Collection Time: 08/07/15  3:45 PM  Result Value Ref Range Status   Specimen Description BLOOD RIGHT ARM  Final   Special Requests BOTTLES DRAWN AEROBIC AND ANAEROBIC 5CC  Final   Culture NO GROWTH 2 DAYS  Final   Report Status PENDING  Incomplete  Culture, blood (routine x 2)     Status: None (Preliminary result)   Collection Time: 08/07/15  4:00 PM  Result Value Ref Range Status   Specimen Description BLOOD RIGHT HAND  Final   Special Requests BOTTLES DRAWN AEROBIC AND ANAEROBIC 5CC  Final   Culture NO GROWTH 2 DAYS  Final  Report Status PENDING  Incomplete     Studies: No results found.  Scheduled Meds: . ceFEPime (MAXIPIME) IV  1 g Intravenous 3 times per day  . enoxaparin (LOVENOX) injection  40 mg Subcutaneous Q24H  . feeding supplement (ENSURE ENLIVE)  237 mL Oral TID BM  . methylPREDNISolone (SOLU-MEDROL) injection  60 mg Intravenous Q6H  . mometasone-formoterol  2 puff Inhalation BID  . multivitamin with minerals  1 tablet Oral Daily  . tiotropium  18 mcg Inhalation Daily  . vancomycin  1,000 mg Intravenous Q12H   Continuous Infusions: . sodium chloride 75 mL/hr at 08/08/15 1938   Time spent: > 35 minutes  Velvet Bathe  [facility] Pager [phone] If  7PM-7AM, please contact night-coverage at [email or web address], password [facility] 08/09/2015, 5:03 PM  LOS: 2 days

## 2015-08-09 NOTE — Plan of Care (Signed)
Problem: Phase II Progression Outcomes Goal: O2 sats > equal to 90% on RA or at baseline Outcome: Progressing Attempt to wean O2 from NRB to Nasal Cannula

## 2015-08-10 DIAGNOSIS — J449 Chronic obstructive pulmonary disease, unspecified: Secondary | ICD-10-CM

## 2015-08-10 DIAGNOSIS — J9601 Acute respiratory failure with hypoxia: Secondary | ICD-10-CM

## 2015-08-10 MED ORDER — FUROSEMIDE 10 MG/ML IJ SOLN
INTRAMUSCULAR | Status: AC
  Administered 2015-08-10: 40 mg via INTRAVENOUS
  Filled 2015-08-10: qty 4

## 2015-08-10 MED ORDER — IPRATROPIUM-ALBUTEROL 0.5-2.5 (3) MG/3ML IN SOLN
3.0000 mL | Freq: Four times a day (QID) | RESPIRATORY_TRACT | Status: DC
  Administered 2015-08-10 – 2015-08-11 (×4): 3 mL via RESPIRATORY_TRACT
  Filled 2015-08-10 (×4): qty 3

## 2015-08-10 MED ORDER — FUROSEMIDE 10 MG/ML IJ SOLN
40.0000 mg | Freq: Once | INTRAMUSCULAR | Status: AC
  Administered 2015-08-10: 40 mg via INTRAVENOUS

## 2015-08-10 MED ORDER — BUDESONIDE 0.25 MG/2ML IN SUSP
0.2500 mg | Freq: Two times a day (BID) | RESPIRATORY_TRACT | Status: DC
  Administered 2015-08-10 – 2015-08-12 (×4): 0.25 mg via RESPIRATORY_TRACT
  Filled 2015-08-10 (×4): qty 2

## 2015-08-10 NOTE — Clinical Documentation Improvement (Signed)
Internal Medicine  Can the diagnosis of Atrial Fibrillation be further specified? Please document findings in next progress note. Thank you!   Chronic Atrial fibrillation  Paroxysmal Atrial fibrillation  Permanent Atrial fibrillation  Persistent Atrial fibrillation  Other  Clinically Undetermined  Document any associated diagnoses/conditions.  Supporting Information:  Lovenox 40 mg SQ q24h  Please exercise your independent, professional judgment when responding. A specific answer is not anticipated or expected.  Thank You,  Zoila Shutter RN, BSN, [place] [phone]; Cell: [phone]

## 2015-08-10 NOTE — Care Management Important Message (Signed)
Important Message  Patient Details  Name: Patrick Hobbs MRN: [id number] Date of Birth: 1934-01-14   Medicare Important Message Given:  Yes    [name] [place] 08/10/2015, 11:25 AM

## 2015-08-10 NOTE — Progress Notes (Addendum)
PROGRESS NOTE  Patrick Hobbs [id number] DOB: September 19, 1933 DOA: 08/07/2015 PCP: [facility] Outpatient Specialists:    LOS: 3 days   Brief Narrative: 82 y.o. male With PMHx of COPD- 3-4L O2 dependant, CHF (unknown kind), and current tobacco use (1 cigarette per day). He was recently seen and d/c'd from [facility] after having the flu/COPD exacerbation. Per wife, patient did "okay" upon discharge. He has not felt well for about 3 months. Grandson said he did better while on the steroid taper. He comes in today via EMS from [facility] with c/o fatigue, weakness, and worsening SOB. He was hypoxic when EMS arrived on his 4L O2 into the 80s. Chest x-ray with right lower lobe pneumonia, and he was admitted and started on treatment for healthcare associated pneumonia.  Assessment & Plan: Active Problems:   Acute respiratory failure with hypoxia (HCC)   Tobacco abuse   Protein-calorie malnutrition, severe   HCAP (healthcare-associated pneumonia)   Pneumonia   Acute on chronic respiratory failure (HCC)   COPD (chronic obstructive pulmonary disease) ([place])   Healthcare associated pneumonia - We'll continue current antibiotic regimen with vancomycin and cefepime - Continue supportive therapy. Lactic acidosis has resolved - Blood cultures and sputum cultures are negative so far  Acute on chronic respiratory failure with hypoxia in the setting of suspected COPD  - patient with long-standing tobacco abuse suspect underlying COPD and chronic respiratory failure at home - Oxygen as needed to maintain sats 88 and above - Discontinue IV fluids as he has bibasilar crackles today and give 1 dose of Lasix - Continue Pulmicort, continue scheduled Duonebs, continue IV steroids - Lasix today  Questionable A flutter-fibrillation - EKGs reviewed, there are 2 EKGs obtained on admission suspect for a flutter, however lead III looks to be in sinus rhythm, I wonder  whether these EKGs were obtained while he was in severe respiratory distress and getting continuous nebulizers, he has been in sinus since, he is in sinus on the monitor so it's difficult to confirm at this point that he has had a flutter   Tobacco abuse - We'll encourage tobacco cessation  Anxiety - low dose lorazepam  DVT prophylaxis: Lovenox Code Status: DNR Family Communication: d/w wife, daughter bedside Disposition Plan: home when ready  Barriers for discharge: hypoxia  Consultants:   None   Procedures:   2D echo: EF 60-65%  BiPAP: prn  Antimicrobials:  Vancomycin 3/12 >>  Cefepime 3/12 >>   Subjective: - Feeling short-winded, on nonrebreather at this morning - Denies any chest pain or palpitations.  Objective: Filed Vitals:   08/10/15 0753 08/10/15 0808 08/10/15 0810 08/10/15 0842  BP:   108/61   Pulse: 52  57   Temp:    97.8 F (36.6 C)  TempSrc:    Axillary  Resp:      Weight:      SpO2: 89% 89%  91%    Intake/Output Summary (Last 24 hours) at 08/10/15 1234 Last data filed at 08/10/15 1115  Gross per 24 hour  Intake   2465 ml  Output   1490 ml  Net    975 ml   Filed Weights   08/08/15 0630  Weight: 54.1 kg (119 lb 4.3 oz)    Examination: BP 108/61 mmHg  Pulse 57  Temp(Src) 97.8 F (36.6 C) (Axillary)  Resp 20  Wt 54.1 kg (119 lb 4.3 oz)  SpO2 91%  GENERAL: NAD, anxious  HEENT: head NCAT, no scleral  icterus. Pupils round and reactive. Mucous membranes are moist.   NECK: Supple.   LUNGS: Decreased air movement overall, scant wheezing, bibasilar crackles  HEART: Regular rate and rhythm without murmur. 2+ pulses, no JVD, no peripheral edema  ABDOMEN: Soft, nontender, and nondistended. Positive bowel sounds.   EXTREMITIES: Without any cyanosis or clubbing  NEUROLOGIC: Alert and oriented x3. Nonfocal  PSYCHIATRIC: Normal mood and affect.   SKIN: No ulceration or induration present. No rashes. No lesions   Data Reviewed: I  have personally reviewed following labs and imaging studies  CBC:  Recent Labs Lab 08/07/15 1443 08/08/15 0701  WBC 9.2 7.3  NEUTROABS 8.1*  --   HGB 15.3 13.9  HCT 45.8 42.9  MCV 90.5 90.9  PLT 236 0000000   Basic Metabolic Panel:  Recent Labs Lab 08/07/15 1443 08/08/15 0701  NA 136 139  K 4.0 4.2  CL 93* 99*  CO2 27 28  GLUCOSE 112* 180*  BUN 43* 41*  CREATININE 0.78 0.58*  CALCIUM 9.1 8.7*   GFR: Estimated Creatinine Clearance: 57.3 mL/min (by C-G formula based on Cr of 0.58). Liver Function Tests: No results for input(s): AST, ALT, ALKPHOS, BILITOT, PROT, ALBUMIN in the last 168 hours. No results for input(s): LIPASE, AMYLASE in the last 168 hours. No results for input(s): AMMONIA in the last 168 hours. Coagulation Profile: No results for input(s): INR, PROTIME in the last 168 hours. Cardiac Enzymes: No results for input(s): CKTOTAL, CKMB, CKMBINDEX, TROPONINI in the last 168 hours. BNP (last 3 results) No results for input(s): PROBNP in the last 8760 hours. HbA1C: No results for input(s): HGBA1C in the last 72 hours. CBG: No results for input(s): GLUCAP in the last 168 hours. Lipid Profile: No results for input(s): CHOL, HDL, LDLCALC, TRIG, CHOLHDL, LDLDIRECT in the last 72 hours. Thyroid Function Tests: No results for input(s): TSH, T4TOTAL, FREET4, T3FREE, THYROIDAB in the last 72 hours. Anemia Panel: No results for input(s): VITAMINB12, FOLATE, FERRITIN, TIBC, IRON, RETICCTPCT in the last 72 hours. Urine analysis:    Component Value Date/Time   COLORURINE STRAW* 07/20/2015 2349   APPEARANCEUR CLEAR 07/20/2015 2349   LABSPEC 1.015 07/20/2015 2349   PHURINE 6.0 07/20/2015 2349   GLUCOSEU NEGATIVE 07/20/2015 2349   HGBUR TRACE* 07/20/2015 2349   BILIRUBINUR NEGATIVE 07/20/2015 2349   KETONESUR NEGATIVE 07/20/2015 2349   PROTEINUR TRACE* 07/20/2015 2349   NITRITE NEGATIVE 07/20/2015 2349   LEUKOCYTESUR NEGATIVE 07/20/2015 2349   Sepsis Labs: Invalid  input(s): PROCALCITONIN, LACTICIDVEN  Recent Results (from the past 240 hour(s))  Culture, blood (routine x 2)     Status: None (Preliminary result)   Collection Time: 08/07/15  3:45 PM  Result Value Ref Range Status   Specimen Description BLOOD RIGHT ARM  Final   Special Requests BOTTLES DRAWN AEROBIC AND ANAEROBIC 5CC  Final   Culture NO GROWTH 3 DAYS  Final   Report Status PENDING  Incomplete  Culture, blood (routine x 2)     Status: None (Preliminary result)   Collection Time: 08/07/15  4:00 PM  Result Value Ref Range Status   Specimen Description BLOOD RIGHT HAND  Final   Special Requests BOTTLES DRAWN AEROBIC AND ANAEROBIC 5CC  Final   Culture NO GROWTH 3 DAYS  Final   Report Status PENDING  Incomplete      Radiology Studies: No results found.   Scheduled Meds: . budesonide (PULMICORT) nebulizer solution  0.25 mg Nebulization BID  . ceFEPime (MAXIPIME) IV  1 g Intravenous  3 times per day  . enoxaparin (LOVENOX) injection  40 mg Subcutaneous Q24H  . feeding supplement (ENSURE ENLIVE)  237 mL Oral TID BM  . ipratropium-albuterol  3 mL Nebulization Q6H  . methylPREDNISolone (SOLU-MEDROL) injection  60 mg Intravenous Q6H  . multivitamin with minerals  1 tablet Oral Daily  . vancomycin  1,000 mg Intravenous Q12H   Continuous Infusions:   Marzetta Board, MD, PhD [facility] Pager [phone]  If 7PM-7AM, please contact night-coverage [email or web address] Password [facility] 08/10/2015, 12:34 PM

## 2015-08-10 NOTE — Clinical Documentation Improvement (Signed)
Internal Medicine  Can the diagnosis of CHF be further specified? Please document response in next progress note NOT in BPA drop down box. Thank you!    Acuity - Acute, Chronic, Acute on Chronic   Type - Systolic, Diastolic, Systolic and Diastolic   CHF ruled out  Other  Clinically Undetermined  Document any associated diagnoses/conditions  Supporting Information:  ECHO performed on 3/11, 3/13 revealed EF of 60-65%, mild hypertrophy of septum, normal systolic function  IV Lasix 40 mg given once; on no other medications for treatment of CHF  Please exercise your independent, professional judgment when responding. A specific answer is not anticipated or expected.  Thank You,  Zoila Shutter RN, BSN, [place] [phone]; Cell: [phone]

## 2015-08-10 NOTE — Plan of Care (Signed)
Problem: ICU Phase Progression Outcomes Goal: O2 sats trending toward baseline Outcome: Progressing IV Lasix x 1 today. SpO2% = 94 - 99% on NRB @ 15 l/min.

## 2015-08-11 ENCOUNTER — Inpatient Hospital Stay (HOSPITAL_COMMUNITY): Payer: Medicare Other

## 2015-08-11 ENCOUNTER — Encounter (HOSPITAL_COMMUNITY): Payer: Self-pay | Admitting: Radiology

## 2015-08-11 DIAGNOSIS — Z720 Tobacco use: Secondary | ICD-10-CM

## 2015-08-11 LAB — BASIC METABOLIC PANEL
ANION GAP: 7 (ref 5–15)
BUN: 36 mg/dL — ABNORMAL HIGH (ref 6–20)
CHLORIDE: 101 mmol/L (ref 101–111)
CO2: 32 mmol/L (ref 22–32)
Calcium: 8.8 mg/dL — ABNORMAL LOW (ref 8.9–10.3)
Creatinine, Ser: 0.49 mg/dL — ABNORMAL LOW (ref 0.61–1.24)
GFR calc Af Amer: >60 mL/min (ref >60)
GFR calc non Af Amer: >60 mL/min (ref >60)
GLUCOSE: 216 mg/dL — AB (ref 65–99)
POTASSIUM: 3.8 mmol/L (ref 3.5–5.1)
Sodium: 140 mmol/L (ref 135–145)

## 2015-08-11 LAB — CBC
HEMATOCRIT: 40.9 % (ref 39.0–52.0)
HEMOGLOBIN: 13.1 g/dL (ref 13.0–17.0)
MCH: 29.6 pg (ref 26.0–34.0)
MCHC: 32 g/dL (ref 30.0–36.0)
MCV: 92.5 fL (ref 78.0–100.0)
Platelets: 232 10*3/uL (ref 150–400)
RBC: 4.42 MIL/uL (ref 4.22–5.81)
RDW: 14.5 % (ref 11.5–15.5)
WBC: 6.3 10*3/uL (ref 4.0–10.5)

## 2015-08-11 MED ORDER — ARFORMOTEROL TARTRATE 15 MCG/2ML IN NEBU
15.0000 ug | INHALATION_SOLUTION | Freq: Two times a day (BID) | RESPIRATORY_TRACT | Status: DC
  Administered 2015-08-12 – 2015-08-16 (×9): 15 ug via RESPIRATORY_TRACT
  Filled 2015-08-11 (×11): qty 2

## 2015-08-11 MED ORDER — IPRATROPIUM-ALBUTEROL 0.5-2.5 (3) MG/3ML IN SOLN
3.0000 mL | RESPIRATORY_TRACT | Status: DC
  Administered 2015-08-11 – 2015-08-12 (×6): 3 mL via RESPIRATORY_TRACT
  Filled 2015-08-11 (×6): qty 3

## 2015-08-11 MED ORDER — IOHEXOL 350 MG/ML SOLN
80.0000 mL | Freq: Once | INTRAVENOUS | Status: AC | PRN
  Administered 2015-08-11: 100 mL via INTRAVENOUS

## 2015-08-11 MED ORDER — FUROSEMIDE 10 MG/ML IJ SOLN
40.0000 mg | Freq: Every day | INTRAMUSCULAR | Status: DC
  Administered 2015-08-11 – 2015-08-16 (×6): 40 mg via INTRAVENOUS
  Filled 2015-08-11 (×6): qty 4

## 2015-08-11 NOTE — Plan of Care (Signed)
Problem: ICU Phase Progression Outcomes Goal: O2 sats trending toward baseline Outcome: Progressing Pt currently on PNRB mask at 10 L O2 sats in the high 90s

## 2015-08-11 NOTE — Progress Notes (Signed)
Inpatient Diabetes Program Recommendations  AACE/ADA: New Consensus Statement on Inpatient Glycemic Control (2015)  Target Ranges:  Prepandial:   less than 140 mg/dL      Peak postprandial:   less than 180 mg/dL (1-2 hours)      Critically ill patients:  140 - 180 mg/dL  Results for CORBETT, SPITTLE [facility] (MRN [id number]) as of 08/11/2015 12:24  Ref. Range 08/08/2015 07:01 08/11/2015 04:20  Glucose Latest Ref Range: 65-99 mg/dL 180 (H) 216 (H)    Diabetes history: No Hx  Inpatient Diabetes Program Recommendations: Noted FBG 180 on 3/12 & 216 on 3/15. Please consider adding CBGs qid with Sensitive Novolog SSI 0-9 units.    Nani Gasser Meshell Abdulaziz, RN, MSN, CDE Inpatient Glycemic Control Team Team Pager [phone] (8am-5pm) 08/11/2015 12:26 PM

## 2015-08-11 NOTE — Progress Notes (Addendum)
[facility]                                                                              Patient Demographics  Patrick Hobbs, is a 82 y.o. male, DOB - 1934/03/02, [id number]  Admit date - 08/07/2015   Admitting Physician Geradine Girt, DO  Outpatient Primary MD for the patient is [facility]  LOS - 4  days    Chief Complaint  Patient presents with  . Shortness of Breath       Brief HPI   82 y.o. male With PMHx of COPD- 3-4L O2 dependant, CHF (unknown kind), and current tobacco use (1 cigarette per day). He was recently seen and d/c'd from [facility] after having the flu/COPD exacerbation. Per wife, patient did "okay" upon discharge. He has not felt well for about 3 months. Grandson said he did better while on the steroid taper. He comes in today via EMS from [facility] with c/o fatigue, weakness, and worsening SOB. He was hypoxic when EMS arrived on his 4L O2 into the 80s. Chest x-ray with right lower lobe pneumonia, and he was admitted and started on treatment for healthcare associated pneumonia.   Assessment & Plan     Healthcare associated pneumonia, acute on chronic respiratory failure with hypoxia - Continue vancomycin and cefepime -  Lactic acidosis has resolved - Blood cultures and sputum cultures are negative so far - Urine legionella antigen, urine strep antigen negative  Acute on chronic respiratory failure with hypoxia in the setting of COPD exacerbation, HCAP, recent influenza - patient with long-standing tobacco abuse suspect underlying COPD and chronic respiratory failure at home - Patient continued to be on NRB, difficult to wean, obtain CTA chest to rule out any underlying pulmonary embolism or worsening of PNA/consolidation - Continue Pulmicort, continue scheduled Duonebs q4hrs, continue IV steroids, added Brovana - Received Lasix 3/14. If no improvement in next 24 hours, will consult  pulmonology Addendum: 2:09pm CTA chest: NO PE, b/l lower lobe consolidation, right mid lung infiltrates, b/l pleural effusions, rt>left    Questionable A flutter - EKGs reviewed, there are 2 EKGs obtained on admission during respiratory distress, EKGs show a flutter, however lead III looks to be in sinus rhythm, NSR on telemetry  - Repeat EKG  Tobacco abuse - Patient counseled on smoking cessation   Anxiety - low dose lorazepam  Code Status: DNR  Family Communication: Discussed in detail with the patient, all imaging results, lab results explained to the patient    Disposition Plan:   Time Spent in minutes   25 minutes  Procedures  Chest x-ray  2-D echo  Consults   None  DVT Prophylaxis  Lovenox   Medications  Scheduled Meds: . arformoterol  15 mcg Nebulization BID  . budesonide (PULMICORT) nebulizer solution  0.25 mg Nebulization BID  . ceFEPime (MAXIPIME) IV  1 g Intravenous 3 times per day  . enoxaparin (LOVENOX) injection  40 mg Subcutaneous Q24H  . feeding supplement (ENSURE ENLIVE)  237 mL Oral TID BM  . ipratropium-albuterol  3 mL Nebulization Q4H  . methylPREDNISolone (SOLU-MEDROL) injection  60 mg Intravenous Q6H  . multivitamin with minerals  1 tablet Oral Daily  . vancomycin  1,000 mg Intravenous Q12H   Continuous Infusions:  PRN Meds:.albuterol, LORazepam   Antibiotics   Anti-infectives    Start     Dose/Rate Route Frequency Ordered Stop   08/09/15 0600  vancomycin (VANCOCIN) IVPB 1000 mg/200 mL premix     1,000 mg 200 mL/hr over 60 Minutes Intravenous Every 12 hours 08/09/15 0534     08/08/15 0600  vancomycin (VANCOCIN) 500 mg in sodium chloride 0.9 % 100 mL IVPB  Status:  Discontinued     500 mg 100 mL/hr over 60 Minutes Intravenous Every 12 hours 08/07/15 1628 08/09/15 0534   08/07/15 2200  ceFEPIme (MAXIPIME) 1 g in dextrose 5 % 50 mL IVPB     1 g 100 mL/hr over 30 Minutes Intravenous 3 times per day 08/07/15 1628     08/07/15 1615   vancomycin (VANCOCIN) IVPB 1000 mg/200 mL premix     1,000 mg 200 mL/hr over 60 Minutes Intravenous  Once 08/07/15 1607 08/07/15 1933   08/07/15 1545  ceFEPIme (MAXIPIME) 1 g in dextrose 5 % 50 mL IVPB     1 g 100 mL/hr over 30 Minutes Intravenous  Once 08/07/15 1536 08/07/15 1656        Subjective:   Kas Shyne was seen and examined today. He continues to have shortness of breath with wheezing, on NRB mask 15L. Patient denies dizziness, chest pain, abdominal pain, N/V/D/C, new weakness, numbess, tingling. No acute events overnight.    Objective:   Filed Vitals:   08/11/15 0335 08/11/15 0736 08/11/15 0741 08/11/15 0744  BP: 120/72     Pulse: 74     Temp: 97.7 F (36.5 C) 97.4 F (36.3 C)    TempSrc: Axillary Axillary    Resp: 17     Weight:      SpO2: 98%  95% 95%    Intake/Output Summary (Last 24 hours) at 08/11/15 1202 Last data filed at 08/11/15 0951  Gross per 24 hour  Intake   1150 ml  Output    890 ml  Net    260 ml     Wt Readings from Last 3 Encounters:  08/08/15 54.1 kg (119 lb 4.3 oz)  07/21/15 59.3 kg (130 lb 11.7 oz)     Exam  General: Alert and oriented x 3, NAD  HEENT:  PERRLA, EOMI, Anicteric Sclera, mucous membranes moist.   Neck: Supple, no JVD, no masses  CVS: S1 S2 auscultated, no rubs, murmurs or gallops. Regular rate and rhythm.  Respiratory: Diffuse wheezing bilaterally  Abdomen: Soft, nontender, nondistended, + bowel sounds  Ext: no cyanosis clubbing or edema  Neuro: AAOx3, Cr N's II- XII. Strength 5/5 upper and lower extremities bilaterally  Skin: No rashes  Psych: Normal affect and demeanor, alert and oriented x3    Data Reviewed:  I have personally reviewed following labs and imaging studies  Micro Results Recent Results (from the past 240 hour(s))  Culture, blood (routine x 2)     Status: None (Preliminary result)   Collection Time: 08/07/15  3:45 PM  Result Value Ref Range Status   Specimen Description BLOOD  RIGHT ARM  Final   Special Requests BOTTLES DRAWN AEROBIC AND ANAEROBIC 5CC  Final   Culture NO GROWTH 3 DAYS  Final   Report Status PENDING  Incomplete  Culture, blood (routine x 2)     Status: None (Preliminary result)  Collection Time: 08/07/15  4:00 PM  Result Value Ref Range Status   Specimen Description BLOOD RIGHT HAND  Final   Special Requests BOTTLES DRAWN AEROBIC AND ANAEROBIC 5CC  Final   Culture NO GROWTH 3 DAYS  Final   Report Status PENDING  Incomplete    Radiology Reports Dg Chest Portable 1 View  08/07/2015  CLINICAL DATA:  82 year old male with progressive shortness of breath EXAM: PORTABLE CHEST 1 VIEW COMPARISON:  Prior chest x-ray 07/20/2015 FINDINGS: New patchy airspace opacity throughout the right lower lobe with thickening along the minor fissure. The cardiac and mediastinal contours are within normal limits. The left lung remains relatively well aerated. Background of emphysema and chronic bronchitic changes is similar compared to prior. No pleural effusion or pneumothorax. No acute osseous abnormality. IMPRESSION: Interval development of patchy right lower lobe airspace opacity concerning for bronchopneumonia. Electronically Signed   By: Jacqulynn Cadet M.D.   On: 08/07/2015 15:23   Dg Chest Port 1 View  07/20/2015  CLINICAL DATA:  Increasing shortness of breath since this morning. Cough and fever. EXAM: PORTABLE CHEST 1 VIEW COMPARISON:  Frontal and lateral views 09/04/2014 FINDINGS: Stable hyperinflation. There is biapical pleural parenchymal scarring. The cardiomediastinal contours are unchanged, heart normal in size. The lungs are clear. Pulmonary vasculature is normal. No consolidation, pleural effusion, or pneumothorax. No acute osseous abnormalities are seen. IMPRESSION: Hyperinflation.  No superimposed acute process. Electronically Signed   By: Jeb Levering M.D.   On: 07/20/2015 23:40    CBC  Recent Labs Lab 08/07/15 1443 08/08/15 0701 08/11/15 0420   WBC 9.2 7.3 6.3  HGB 15.3 13.9 13.1  HCT 45.8 42.9 40.9  PLT 236 233 232  MCV 90.5 90.9 92.5  MCH 30.2 29.4 29.6  MCHC 33.4 32.4 32.0  RDW 15.0 15.2 14.5  LYMPHSABS 0.6*  --   --   MONOABS 0.5  --   --   EOSABS 0.0  --   --   BASOSABS 0.0  --   --     Chemistries   Recent Labs Lab 08/07/15 1443 08/08/15 0701 08/11/15 0420  NA 136 139 140  K 4.0 4.2 3.8  CL 93* 99* 101  CO2 27 28 32  GLUCOSE 112* 180* 216*  BUN 43* 41* 36*  CREATININE 0.78 0.58* 0.49*  CALCIUM 9.1 8.7* 8.8*   ------------------------------------------------------------------------------------------------------------------ estimated creatinine clearance is 57.3 mL/min (by C-G formula based on Cr of 0.49). ------------------------------------------------------------------------------------------------------------------ No results for input(s): HGBA1C in the last 72 hours. ------------------------------------------------------------------------------------------------------------------ No results for input(s): CHOL, HDL, LDLCALC, TRIG, CHOLHDL, LDLDIRECT in the last 72 hours. ------------------------------------------------------------------------------------------------------------------ No results for input(s): TSH, T4TOTAL, T3FREE, THYROIDAB in the last 72 hours.  Invalid input(s): FREET3 ------------------------------------------------------------------------------------------------------------------ No results for input(s): VITAMINB12, FOLATE, FERRITIN, TIBC, IRON, RETICCTPCT in the last 72 hours.  Coagulation profile No results for input(s): INR, PROTIME in the last 168 hours.  No results for input(s): DDIMER in the last 72 hours.  Cardiac Enzymes No results for input(s): CKMB, TROPONINI, MYOGLOBIN in the last 168 hours.  Invalid input(s): CK ------------------------------------------------------------------------------------------------------------------ Invalid input(s): POCBNP  No results  for input(s): GLUCAP in the last 72 hours.   Labria Wos M.D. [facility] 08/11/2015, 12:02 PM  Pager: [phone] Between 7am to 7pm - call Pager - [phone]  After 7pm go to [email or web address] - password [email or web address]  Call night coverage person covering after 7pm

## 2015-08-11 NOTE — Progress Notes (Signed)
Rt gave pt flutter valve. Pt knows and understands how to use. 

## 2015-08-12 ENCOUNTER — Inpatient Hospital Stay (HOSPITAL_COMMUNITY): Payer: Medicare Other

## 2015-08-12 DIAGNOSIS — E43 Unspecified severe protein-calorie malnutrition: Secondary | ICD-10-CM

## 2015-08-12 DIAGNOSIS — J432 Centrilobular emphysema: Secondary | ICD-10-CM

## 2015-08-12 DIAGNOSIS — J9620 Acute and chronic respiratory failure, unspecified whether with hypoxia or hypercapnia: Secondary | ICD-10-CM

## 2015-08-12 DIAGNOSIS — J189 Pneumonia, unspecified organism: Secondary | ICD-10-CM

## 2015-08-12 LAB — CULTURE, BLOOD (ROUTINE X 2)
Culture: NO GROWTH
Culture: NO GROWTH

## 2015-08-12 LAB — CBC
HCT: 41 % (ref 39.0–52.0)
Hemoglobin: 13.8 g/dL (ref 13.0–17.0)
MCH: 30.9 pg (ref 26.0–34.0)
MCHC: 33.7 g/dL (ref 30.0–36.0)
MCV: 91.9 fL (ref 78.0–100.0)
PLATELETS: 221 10*3/uL (ref 150–400)
RBC: 4.46 MIL/uL (ref 4.22–5.81)
RDW: 14.2 % (ref 11.5–15.5)
WBC: 8.9 10*3/uL (ref 4.0–10.5)

## 2015-08-12 LAB — BASIC METABOLIC PANEL
Anion gap: 7 (ref 5–15)
BUN: 35 mg/dL — AB (ref 6–20)
CALCIUM: 8.7 mg/dL — AB (ref 8.9–10.3)
CO2: 33 mmol/L — ABNORMAL HIGH (ref 22–32)
CREATININE: 0.57 mg/dL — AB (ref 0.61–1.24)
Chloride: 98 mmol/L — ABNORMAL LOW (ref 101–111)
GFR calc Af Amer: >60 mL/min (ref >60)
Glucose, Bld: 231 mg/dL — ABNORMAL HIGH (ref 65–99)
POTASSIUM: 4 mmol/L (ref 3.5–5.1)
SODIUM: 138 mmol/L (ref 135–145)

## 2015-08-12 LAB — GLUCOSE, CAPILLARY
GLUCOSE-CAPILLARY: 250 mg/dL — AB (ref 65–99)
Glucose-Capillary: 269 mg/dL — ABNORMAL HIGH (ref 65–99)
Glucose-Capillary: 196 mg/dL — ABNORMAL HIGH (ref 65–99)
Glucose-Capillary: 235 mg/dL — ABNORMAL HIGH (ref 65–99)

## 2015-08-12 LAB — VANCOMYCIN, TROUGH: Vancomycin Tr: 15 ug/mL (ref 10.0–20.0)

## 2015-08-12 MED ORDER — INSULIN ASPART 100 UNIT/ML ~~LOC~~ SOLN
0.0000 [IU] | Freq: Every day | SUBCUTANEOUS | Status: DC
  Administered 2015-08-12: 2 [IU] via SUBCUTANEOUS

## 2015-08-12 MED ORDER — INSULIN ASPART 100 UNIT/ML ~~LOC~~ SOLN
0.0000 [IU] | Freq: Three times a day (TID) | SUBCUTANEOUS | Status: DC
  Administered 2015-08-12: 5 [IU] via SUBCUTANEOUS
  Administered 2015-08-12: 2 [IU] via SUBCUTANEOUS
  Administered 2015-08-12: 3 [IU] via SUBCUTANEOUS
  Administered 2015-08-13: 1 [IU] via SUBCUTANEOUS
  Administered 2015-08-13: 3 [IU] via SUBCUTANEOUS
  Administered 2015-08-13 – 2015-08-14 (×2): 1 [IU] via SUBCUTANEOUS
  Administered 2015-08-14 (×2): 2 [IU] via SUBCUTANEOUS
  Administered 2015-08-15: 3 [IU] via SUBCUTANEOUS
  Administered 2015-08-15 – 2015-08-16 (×3): 2 [IU] via SUBCUTANEOUS

## 2015-08-12 MED ORDER — METHYLPREDNISOLONE SODIUM SUCC 40 MG IJ SOLR
40.0000 mg | Freq: Two times a day (BID) | INTRAMUSCULAR | Status: DC
  Administered 2015-08-12 – 2015-08-16 (×8): 40 mg via INTRAVENOUS
  Filled 2015-08-12 (×9): qty 1

## 2015-08-12 MED ORDER — IPRATROPIUM-ALBUTEROL 0.5-2.5 (3) MG/3ML IN SOLN
3.0000 mL | Freq: Four times a day (QID) | RESPIRATORY_TRACT | Status: DC
Start: 2015-08-12 — End: 2015-08-16
  Administered 2015-08-12 – 2015-08-16 (×14): 3 mL via RESPIRATORY_TRACT
  Filled 2015-08-12 (×16): qty 3

## 2015-08-12 MED ORDER — BUDESONIDE 0.5 MG/2ML IN SUSP
0.5000 mg | Freq: Two times a day (BID) | RESPIRATORY_TRACT | Status: DC
  Administered 2015-08-12 – 2015-08-16 (×8): 0.5 mg via RESPIRATORY_TRACT
  Filled 2015-08-12 (×8): qty 2

## 2015-08-12 MED ORDER — IPRATROPIUM-ALBUTEROL 0.5-2.5 (3) MG/3ML IN SOLN
3.0000 mL | Freq: Four times a day (QID) | RESPIRATORY_TRACT | Status: DC
  Administered 2015-08-12: 3 mL via RESPIRATORY_TRACT
  Filled 2015-08-12: qty 3

## 2015-08-12 NOTE — Progress Notes (Signed)
Inpatient Diabetes Program Recommendations  AACE/ADA: New Consensus Statement on Inpatient Glycemic Control (2015)  Target Ranges:  Prepandial:   less than 140 mg/dL      Peak postprandial:   less than 180 mg/dL (1-2 hours)      Critically ill patients:  140 - 180 mg/dL   Review of Glycemic Control  Results for Patrick Hobbs, Patrick Hobbs [facility] (MRN [id number]) as of 08/12/2015 14:06  Ref. Range 08/12/2015 07:37 08/12/2015 11:12  Glucose-Capillary Latest Ref Range: 65-99 mg/dL 269 (H) 196 (H)    Diabetes history: Type 2 Outpatient Diabetes medications: none Current orders for Inpatient glycemic control: Novolog 0-9 units tid, Novolog 0-5 units qhs  * solumedrol 60mg  q6h  Inpatient Diabetes Program Recommendations:  Agree with current orders for diabetes medications.  Noted that Novolog hs correction will begin this evening.  Gentry Fitz, RN, BA, MHA, CDE Diabetes Coordinator Inpatient Diabetes Program  [phone] (Team Pager) [phone] ([place]) 08/12/2015 2:12 PM

## 2015-08-12 NOTE — Progress Notes (Signed)
[facility]                                                                              Patient Demographics  Patrick Hobbs, is a 82 y.o. male, DOB - 07/06/1933, [id number]  Admit date - 08/07/2015   Admitting Physician Geradine Girt, DO  Outpatient Primary MD for the patient is [facility]  LOS - 5  days    Chief Complaint  Patient presents with  . Shortness of Breath       Brief HPI   82 y.o. male With PMHx of COPD- 3-4L O2 dependant, CHF (unknown kind), and current tobacco use (1 cigarette per day). He was recently seen and d/c'd from [facility] after having the flu/COPD exacerbation. Per wife, patient did "okay" upon discharge. He has not felt well for about 3 months. Grandson said he did better while on the steroid taper. He comes in today via EMS from [facility] with c/o fatigue, weakness, and worsening SOB. He was hypoxic when EMS arrived on his 4L O2 into the 80s. Chest x-ray with right lower lobe pneumonia, and he was admitted and started on treatment for healthcare associated pneumonia.   Assessment & Plan     Healthcare associated pneumonia, acute on chronic respiratory failure with hypoxia, chronically on 3-4 L O2 at home - Currently on Ventimask, FiO2 55%, no significant improvement - Continue vancomycin and cefepime, scheduled nebs, IV Lasix, IV Solu-Medrol, Pulmicort, Brovana -  Lactic acidosis has resolved - Blood cultures and sputum cultures are negative so far - Urine legionella antigen, urine strep antigen negative - Consulted pulmonology, will follow recommendations  Acute on chronic respiratory failure with hypoxia in the setting of COPD exacerbation, HCAP, recent influenza - patient with long-standing tobacco abuse suspect underlying COPD and chronic respiratory failure at home -  Continue Pulmicort, continue scheduled Duonebs, continue IV steroids, Brovana - CTA chest: NO PE, b/l lower lobe  consolidation, right mid lung infiltrates, b/l pleural effusions, rt>left, placed on diuresis - 2-D echo 3/13 had shown EF of 60-65%   Questionable A flutter - EKGs reviewed, there are 2 EKGs obtained on admission during respiratory distress, EKGs show a flutter, however lead III looks to be in sinus rhythm, NSR on telemetry   Tobacco abuse - Patient counseled on smoking cessation   Anxiety - low dose lorazepam  Code Status: DNR  Family Communication: Discussed in detail with the patient, all imaging results, lab results explained to the patient    Disposition Plan:   Time Spent in minutes   25 minutes  Procedures  Chest x-ray  2-D echo CT angiogram chest  Consults   Pulmonology  DVT Prophylaxis  Lovenox   Medications  Scheduled Meds: . arformoterol  15 mcg Nebulization BID  . budesonide (PULMICORT) nebulizer solution  0.5 mg Nebulization BID  . ceFEPime (MAXIPIME) IV  1 g Intravenous 3 times per day  . enoxaparin (LOVENOX) injection  40 mg Subcutaneous Q24H  . feeding supplement (ENSURE ENLIVE)  237 mL Oral TID BM  . furosemide  40 mg Intravenous Daily  . insulin aspart  0-5 Units Subcutaneous QHS  . insulin aspart  0-9 Units Subcutaneous TID WC  . ipratropium-albuterol  3 mL Nebulization Q6H  . methylPREDNISolone (SOLU-MEDROL) injection  60 mg Intravenous Q6H  . multivitamin with minerals  1 tablet Oral Daily  . vancomycin  1,000 mg Intravenous Q12H   Continuous Infusions:  PRN Meds:.albuterol, LORazepam   Antibiotics   Anti-infectives    Start     Dose/Rate Route Frequency Ordered Stop   08/09/15 0600  vancomycin (VANCOCIN) IVPB 1000 mg/200 mL premix     1,000 mg 200 mL/hr over 60 Minutes Intravenous Every 12 hours 08/09/15 0534     08/08/15 0600  vancomycin (VANCOCIN) 500 mg in sodium chloride 0.9 % 100 mL IVPB  Status:  Discontinued     500 mg 100 mL/hr over 60 Minutes Intravenous Every 12 hours 08/07/15 1628 08/09/15 0534   08/07/15 2200  ceFEPIme  (MAXIPIME) 1 g in dextrose 5 % 50 mL IVPB     1 g 100 mL/hr over 30 Minutes Intravenous 3 times per day 08/07/15 1628     08/07/15 1615  vancomycin (VANCOCIN) IVPB 1000 mg/200 mL premix     1,000 mg 200 mL/hr over 60 Minutes Intravenous  Once 08/07/15 1607 08/07/15 1933   08/07/15 1545  ceFEPIme (MAXIPIME) 1 g in dextrose 5 % 50 mL IVPB     1 g 100 mL/hr over 30 Minutes Intravenous  Once 08/07/15 1536 08/07/15 1656        Subjective:   Patrick Hobbs was seen and examined today. Continues to have shortness of breath with wheezing, on Ventimask. Patient denies dizziness, chest pain, abdominal pain, N/V/D/C, new weakness, numbess, tingling. No acute events overnight.    Objective:   Filed Vitals:   08/12/15 0058 08/12/15 0610 08/12/15 0807 08/12/15 0811  BP: 119/68 119/67    Pulse: 93 75    Temp: 97.7 F (36.5 C) 98.1 F (36.7 C)    TempSrc: Oral Oral    Resp:  17    Weight:  58.7 kg (129 lb 6.6 oz)    SpO2: 92% 94% 91% 91%    Intake/Output Summary (Last 24 hours) at 08/12/15 1117 Last data filed at 08/12/15 1112  Gross per 24 hour  Intake    660 ml  Output   2750 ml  Net  -2090 ml     Wt Readings from Last 3 Encounters:  08/12/15 58.7 kg (129 lb 6.6 oz)  07/21/15 59.3 kg (130 lb 11.7 oz)     Exam  General: Alert and oriented x 3, NAD, on Ventimask  HEENT:   Neck:   CVS: S1 S2 clear, RRR  Respiratory: Diffuse wheezing bilaterally  Abdomen: Soft, nontender, nondistended, + bowel sounds  Ext: no cyanosis clubbing or edema  Neuro: no new deficits  Skin: No rashes  Psych: Normal affect and demeanor, alert and oriented x3    Data Reviewed:  I have personally reviewed following labs and imaging studies  Micro Results Recent Results (from the past 240 hour(s))  Culture, blood (routine x 2)     Status: None   Collection Time: 08/07/15  3:45 PM  Result Value Ref Range Status   Specimen Description BLOOD RIGHT ARM  Final   Special Requests BOTTLES  DRAWN AEROBIC AND ANAEROBIC 5CC  Final   Culture NO GROWTH 5 DAYS  Final   Report Status 08/12/2015 FINAL  Final  Culture, blood (routine x 2)     Status: None   Collection  Time: 08/07/15  4:00 PM  Result Value Ref Range Status   Specimen Description BLOOD RIGHT HAND  Final   Special Requests BOTTLES DRAWN AEROBIC AND ANAEROBIC 5CC  Final   Culture NO GROWTH 5 DAYS  Final   Report Status 08/12/2015 FINAL  Final    Radiology Reports Ct Angio Chest Pe W/cm &/or Wo Cm  08/11/2015  CLINICAL DATA:  Fatigue, weakness, worsening shortness of breath, oxygen desaturation in 80s, RIGHT lower lobe pneumonia, COPD, hypertension, CHF, smoker, history prostate cancer EXAM: CT ANGIOGRAPHY CHEST WITH CONTRAST TECHNIQUE: Multidetector CT imaging of the chest was performed using the standard protocol during bolus administration of intravenous contrast. Multiplanar CT image reconstructions and MIPs were obtained to evaluate the vascular anatomy. CONTRAST:  152mL OMNIPAQUE IOHEXOL 350 MG/ML SOLN IV COMPARISON:  None FINDINGS: Scattered atherosclerotic calcifications aorta, proximal great vessels and coronary arteries. Upper normal caliber ascending thoracic aorta 3.8 cm diameter image 48. No aortic dissection. Pulmonary arteries well opacified and grossly patent. No evidence of pulmonary embolism. Minimally prominent precarinal lymph node 11 mm short axis image 40. Additional scattered normal sized mediastinal and hilar nodes. Large cyst upper pole LEFT kidney 6.0 x 5.5 cm image 110. Stomach incompletely distended, unable to exclude gastric wall thickening. Remaining visualized upper abdomen unremarkable. BILATERAL pleural effusions RIGHT greater than LEFT. BILATERAL lower lobe consolidation and volume loss greater on RIGHT. Severe underlying emphysematous changes with mild infiltrate in the posterior RIGHT upper lobe. Scattered central peribronchial thickening. No pneumothorax or definite mass/nodule. Bones  demineralized. Review of the MIP images confirms the above findings. IMPRESSION: No evidence of pulmonary embolism. COPD changes with BILATERAL lower lobe consolidation and volume loss RIGHT greater than LEFT with additional mild infiltrate in posterior RIGHT upper lobe. BILATERAL pleural effusions RIGHT greater than LEFT. Large LEFT renal cyst. Electronically Signed   By: Lavonia Dana M.D.   On: 08/11/2015 13:46   Dg Chest Portable 1 View  08/12/2015  CLINICAL DATA:  Hypoxia. EXAM: PORTABLE CHEST 1 VIEW COMPARISON:  08/11/2015, 311 1,017 FINDINGS: Lungs are mildly hyperinflated. Emphysematous changes are present. There has been some improvement in aeration of the lung bases, consistent with resolving edema. IMPRESSION: 1. Improving aeration. 2. Persistent bibasilar opacities. 3. Emphysema. Electronically Signed   By: Nolon Nations M.D.   On: 08/12/2015 11:06   Dg Chest Portable 1 View  08/07/2015  CLINICAL DATA:  82 year old male with progressive shortness of breath EXAM: PORTABLE CHEST 1 VIEW COMPARISON:  Prior chest x-ray 07/20/2015 FINDINGS: New patchy airspace opacity throughout the right lower lobe with thickening along the minor fissure. The cardiac and mediastinal contours are within normal limits. The left lung remains relatively well aerated. Background of emphysema and chronic bronchitic changes is similar compared to prior. No pleural effusion or pneumothorax. No acute osseous abnormality. IMPRESSION: Interval development of patchy right lower lobe airspace opacity concerning for bronchopneumonia. Electronically Signed   By: Jacqulynn Cadet M.D.   On: 08/07/2015 15:23   Dg Chest Port 1 View  07/20/2015  CLINICAL DATA:  Increasing shortness of breath since this morning. Cough and fever. EXAM: PORTABLE CHEST 1 VIEW COMPARISON:  Frontal and lateral views 09/04/2014 FINDINGS: Stable hyperinflation. There is biapical pleural parenchymal scarring. The cardiomediastinal contours are unchanged,  heart normal in size. The lungs are clear. Pulmonary vasculature is normal. No consolidation, pleural effusion, or pneumothorax. No acute osseous abnormalities are seen. IMPRESSION: Hyperinflation.  No superimposed acute process. Electronically Signed   By: Fonnie Birkenhead.D.  On: 07/20/2015 23:40    CBC  Recent Labs Lab 08/07/15 1443 08/08/15 0701 08/11/15 0420 08/12/15 0530  WBC 9.2 7.3 6.3 8.9  HGB 15.3 13.9 13.1 13.8  HCT 45.8 42.9 40.9 41.0  PLT 236 233 232 221  MCV 90.5 90.9 92.5 91.9  MCH 30.2 29.4 29.6 30.9  MCHC 33.4 32.4 32.0 33.7  RDW 15.0 15.2 14.5 14.2  LYMPHSABS 0.6*  --   --   --   MONOABS 0.5  --   --   --   EOSABS 0.0  --   --   --   BASOSABS 0.0  --   --   --     Chemistries   Recent Labs Lab 08/07/15 1443 08/08/15 0701 08/11/15 0420 08/12/15 0530  NA 136 139 140 138  K 4.0 4.2 3.8 4.0  CL 93* 99* 101 98*  CO2 27 28 32 33*  GLUCOSE 112* 180* 216* 231*  BUN 43* 41* 36* 35*  CREATININE 0.78 0.58* 0.49* 0.57*  CALCIUM 9.1 8.7* 8.8* 8.7*   ------------------------------------------------------------------------------------------------------------------ estimated creatinine clearance is 62.2 mL/min (by C-G formula based on Cr of 0.57). ------------------------------------------------------------------------------------------------------------------ No results for input(s): HGBA1C in the last 72 hours. ------------------------------------------------------------------------------------------------------------------ No results for input(s): CHOL, HDL, LDLCALC, TRIG, CHOLHDL, LDLDIRECT in the last 72 hours. ------------------------------------------------------------------------------------------------------------------ No results for input(s): TSH, T4TOTAL, T3FREE, THYROIDAB in the last 72 hours.  Invalid input(s): FREET3 ------------------------------------------------------------------------------------------------------------------ No results for  input(s): VITAMINB12, FOLATE, FERRITIN, TIBC, IRON, RETICCTPCT in the last 72 hours.  Coagulation profile No results for input(s): INR, PROTIME in the last 168 hours.  No results for input(s): DDIMER in the last 72 hours.  Cardiac Enzymes No results for input(s): CKMB, TROPONINI, MYOGLOBIN in the last 168 hours.  Invalid input(s): CK ------------------------------------------------------------------------------------------------------------------ Invalid input(s): POCBNP   Recent Labs  08/12/15 0737  GLUCAP 269*     Oksana Deberry M.D. [facility] 08/12/2015, 11:17 AM  Pager: [phone] Between 7am to 7pm - call Pager - [phone]  After 7pm go to [email or web address] - password [email or web address]  Call night coverage person covering after 7pm

## 2015-08-12 NOTE — Progress Notes (Signed)
Name: Patrick Hobbs MRN: [id number] DOB: 12/02/1933    ADMISSION DATE:  08/07/2015 CONSULTATION DATE:  3/16  REFERRING MD :  Tana Coast ([facility])   CHIEF COMPLAINT:  Hypoxia   BRIEF PATIENT DESCRIPTION:   82yo male smoker with hx COPD on 4L home O2, HTN, CHF with recent admit to [facility] for flu, AECOPD.  Returned 3/11 with SOB, fatigue, weakness and RLL infiltrate.  Admitted by [facility] with HCAP +/- AECOPD.  On 3/16 pt had worsening hypoxia and PCCM consulted.   SIGNIFICANT EVENTS    STUDIES:  2D echo 3/13>>> EF 60-65%, normal PASP, insufficient study CTA chest 3/15>>> R>L bilateral consolidation, R>L effusion.    HISTORY OF PRESENT ILLNESS:   82yo male with hx COPD on 4L home O2, HTN, CHF with recent admit to [facility] for flu, AECOPD.  Returned 3/11 with SOB, fatigue, weakness and RLL infiltrate.  Admitted by [facility] with HCAP +/- AECOPD and treated with vanc/cefepime.  On 3/16 pt had worsening hypoxia and PCCM consulted.   Currently feeling better.  Wants to go home.  Does c/o SOB but only slightly more than baseline.  Denies chest pain, purulent sputum, hemoptysis, edema.  Mild orthopnea.    PAST MEDICAL HISTORY :   has a past medical history of COPD (chronic obstructive pulmonary disease) ([place]); Hypertension; CHF (congestive heart failure) ([place]); Tobacco use; Complication of anesthesia; On home oxygen therapy; Pneumonia (X 2); HCAP (healthcare-associated pneumonia) (08/09/2015); Migraine; Arthritis; Anxiety; Depression; and Prostate cancer ([place]).  has past surgical history that includes Cataract extraction (Left); Mass excision (Left, 05/2003); and Prostate biopsy. Prior to Admission medications   Medication Sig Start Date End Date Taking? Authorizing Provider  albuterol (PROVENTIL HFA;VENTOLIN HFA) 108 (90 Base) MCG/ACT inhaler Inhale 1-2 puffs into the lungs every 6 (six) hours as needed for wheezing or shortness of breath.   Yes Historical Provider, MD  amLODipine (NORVASC) 10 MG  tablet Take 10 mg by mouth daily.   Yes Historical Provider, MD  budesonide-formoterol (SYMBICORT) 160-4.5 MCG/ACT inhaler Inhale 1 puff into the lungs 2 (two) times daily. 07/22/15  Yes Erline Hau, MD  ibuprofen (ADVIL,MOTRIN) 200 MG tablet Take 600 mg by mouth 2 (two) times daily as needed for moderate pain.   Yes Historical Provider, MD  Menthol, Topical Analgesic, (ICY HOT EX) Apply 1 application topically 2 (two) times daily as needed (for pian).   Yes Historical Provider, MD  metoprolol succinate (TOPROL-XL) 100 MG 24 hr tablet Take 100 mg by mouth daily. Take with or immediately following a meal.   Yes Historical Provider, MD  NASONEX 50 MCG/ACT nasal spray Place 1-2 sprays into both nostrils daily. 07/15/15  Yes Historical Provider, MD  Multiple Vitamin (MULTIVITAMIN WITH MINERALS) TABS tablet Take 1 tablet by mouth daily. Patient not taking: Reported on 08/07/2015 07/22/15   Erline Hau, MD   Allergies  Allergen Reactions  . Flovent Diskus [Fluticasone Propionate (Inhal)] Other (See Comments)    headache    FAMILY HISTORY:  family history is not on file.  No known family hx lung cancer.  SOCIAL HISTORY:  reports that he has been smoking.  He has never used smokeless tobacco. He reports that he drinks alcohol. He reports that he does not use illicit drugs.  REVIEW OF SYSTEMS:   As per HPI - All other systems reviewed and were neg.   SUBJECTIVE:   VITAL SIGNS: Temp:  [97.7 F (36.5 C)-98.2 F (36.8 C)] 98.1 F (36.7  C) (03/16 0610) Pulse Rate:  [75-93] 75 (03/16 0610) Resp:  [17-18] 17 (03/16 0610) BP: (119-122)/(67-78) 119/67 mmHg (03/16 0610) SpO2:  [88 %-96 %] 91 % (03/16 0811) FiO2 (%):  [55 %] 55 % (03/16 0807) Weight:  [58.7 kg (129 lb 6.6 oz)] 58.7 kg (129 lb 6.6 oz) (03/16 0610)  PHYSICAL EXAMINATION: General:  Thin, frail, elderly male, NAD  Neuro:  Awake, alert, appropriate, MAE  HEENT:  Mm moist, venti mask  Cardiovascular:  s1s2  rrr Lungs:  resps even non labored on venti mask, diminished throughout with rare exp wheeze  Abdomen:  Soft, non tender, +bs  Musculoskeletal:  Warm and dry, scant BLE edema    Recent Labs Lab 08/08/15 0701 08/11/15 0420 08/12/15 0530  NA 139 140 138  K 4.2 3.8 4.0  CL 99* 101 98*  CO2 28 32 33*  BUN 41* 36* 35*  CREATININE 0.58* 0.49* 0.57*  GLUCOSE 180* 216* 231*    Recent Labs Lab 08/08/15 0701 08/11/15 0420 08/12/15 0530  HGB 13.9 13.1 13.8  HCT 42.9 40.9 41.0  WBC 7.3 6.3 8.9  PLT 233 232 221   Ct Angio Chest Pe W/cm &/or Wo Cm  08/11/2015  CLINICAL DATA:  Fatigue, weakness, worsening shortness of breath, oxygen desaturation in 80s, RIGHT lower lobe pneumonia, COPD, hypertension, CHF, smoker, history prostate cancer EXAM: CT ANGIOGRAPHY CHEST WITH CONTRAST TECHNIQUE: Multidetector CT imaging of the chest was performed using the standard protocol during bolus administration of intravenous contrast. Multiplanar CT image reconstructions and MIPs were obtained to evaluate the vascular anatomy. CONTRAST:  168mL OMNIPAQUE IOHEXOL 350 MG/ML SOLN IV COMPARISON:  None FINDINGS: Scattered atherosclerotic calcifications aorta, proximal great vessels and coronary arteries. Upper normal caliber ascending thoracic aorta 3.8 cm diameter image 48. No aortic dissection. Pulmonary arteries well opacified and grossly patent. No evidence of pulmonary embolism. Minimally prominent precarinal lymph node 11 mm short axis image 40. Additional scattered normal sized mediastinal and hilar nodes. Large cyst upper pole LEFT kidney 6.0 x 5.5 cm image 110. Stomach incompletely distended, unable to exclude gastric wall thickening. Remaining visualized upper abdomen unremarkable. BILATERAL pleural effusions RIGHT greater than LEFT. BILATERAL lower lobe consolidation and volume loss greater on RIGHT. Severe underlying emphysematous changes with mild infiltrate in the posterior RIGHT upper lobe. Scattered central  peribronchial thickening. No pneumothorax or definite mass/nodule. Bones demineralized. Review of the MIP images confirms the above findings. IMPRESSION: No evidence of pulmonary embolism. COPD changes with BILATERAL lower lobe consolidation and volume loss RIGHT greater than LEFT with additional mild infiltrate in posterior RIGHT upper lobe. BILATERAL pleural effusions RIGHT greater than LEFT. Large LEFT renal cyst. Electronically Signed   By: Lavonia Dana M.D.   On: 08/11/2015 13:46    ASSESSMENT / PLAN:  Acute hypoxic respiratory failure  - r/t HCAP and AECOPD +/- decompensated CHF.   AECOPD  HCAP  Small R>L pleural effusion   PLAN -  Wean FiO2 for sats 88-90%  Continue diuresis as BP and SCr tol  Broad spectrum abx per primary  Continue solumedrol  BD's Pulmonary hygiene  F/u CXR now  PRN bipap  DNR/DNI noted    Nickolas Madrid, NP 08/12/2015  11:03 AM Pager: [phone] or [phone]   Reviewed above, examined.  82 yo severe COPD/emphysema complicated by influenza in February 2017 and now with HCAP.  He also has protein calorie malnutrition, deconditioning, and acute on chronic respiratory failure.  He has cough, but not much sputum.  He denies chest pain or wheeze.  He wants to go home.  Thin.  Wearing VM.  No stridor.  Prolonged exhalation with decreased BS at bases, no wheeze.  HR regular.  Abd soft.  Decreased muscle bulk, but no edema.  CT chest with lower lobe infiltrates and extensive changes of emphysema.  Assessment/plan: Acute on chronic respiratory failure. - oxygen to keep SpO2 88 to 95% - BiPAP prn - DNR/DNI otherwise  HCAP. - Abx per primary team  AECOPD with severe COPD/emphysema. - continue brovana, pulmicort, duoneb - wean off solumedrol >> likely can change to prednisone soon  Very little else to offer from pulmonary standpoint.  If his respiratory status gets worse, then could consider further discussions with family about goals of care  and palliative care consult.  He states he has referral to pulmonary as outpt already set up outside of [place].  PCCM will sign off.  Please call if additional help is needed.  Chesley Mires, MD [facility] Pulmonary/Critical Care 08/12/2015, 2:34 PM Pager:  [phone] After 3pm call: [phone]

## 2015-08-12 NOTE — Progress Notes (Signed)
Pharmacy Antibiotic Note Patrick Hobbs is a 82 y.o. male admitted on 08/07/2015 with worsening SOB in setting of COPD and recent history of influenza B for which he received treatment. Pt on Cefepime and vancomycin (Day #6) for pneumonia.  Vancomycin trough 15 mcg/ml drawn an hour late (therapeutic for goal of 15-20 mcg/ml) on 1 g IV q12h. WBC wnl, afebrile.  Plan: 1. Continue Vancomycin to 1000 mg Q12 hours 2. Continue Cefepime 1 gram Q8H 3. F/u cx data, clinical response and other pertinent labs/levels and narrow abx as feasible   Weight: 129 lb 6.6 oz (58.7 kg)  Temp (24hrs), Avg:98 F (36.7 C), Min:97.7 F (36.5 C), Max:98.2 F (36.8 C)   Recent Labs Lab 08/07/15 1443 08/07/15 1455 08/08/15 0701 08/09/15 0412 08/11/15 0420 08/12/15 0530 08/12/15 1838  WBC 9.2  --  7.3  --  6.3 8.9  --   CREATININE 0.78  --  0.58*  --  0.49* 0.57*  --   LATICACIDVEN  --  2.97* 1.3  --   --   --   --   VANCOTROUGH  --   --   --  7*  --   --  15       Allergies  Allergen Reactions  . Flovent Diskus [Fluticasone Propionate (Inhal)] Other (See Comments)    headache    Antimicrobials this admission: 3/11 Cefepime >> 3/11 Vancomycin >>  Dose adjustments this admission: 3/16 VT 15 on 1 gm IV Q 12 hours   Microbiology results: 3/11 blood >> NGTD MRSA PCR negative on 2/22  Thank you for allowing pharmacy to be a part of this patient's care.  Albertina Parr, PharmD., BCPS Clinical Pharmacist Pager [phone]

## 2015-08-13 LAB — CBC
HEMATOCRIT: 41.6 % (ref 39.0–52.0)
Hemoglobin: 13.5 g/dL (ref 13.0–17.0)
MCH: 29.6 pg (ref 26.0–34.0)
MCHC: 32.5 g/dL (ref 30.0–36.0)
MCV: 91.2 fL (ref 78.0–100.0)
PLATELETS: 206 10*3/uL (ref 150–400)
RBC: 4.56 MIL/uL (ref 4.22–5.81)
RDW: 14.1 % (ref 11.5–15.5)
WBC: 11.5 10*3/uL — AB (ref 4.0–10.5)

## 2015-08-13 LAB — GLUCOSE, CAPILLARY
GLUCOSE-CAPILLARY: 144 mg/dL — AB (ref 65–99)
GLUCOSE-CAPILLARY: 147 mg/dL — AB (ref 65–99)
Glucose-Capillary: 141 mg/dL — ABNORMAL HIGH (ref 65–99)

## 2015-08-13 LAB — BASIC METABOLIC PANEL
Anion gap: 5 (ref 5–15)
BUN: 36 mg/dL — AB (ref 6–20)
CALCIUM: 8.6 mg/dL — AB (ref 8.9–10.3)
CO2: 36 mmol/L — ABNORMAL HIGH (ref 22–32)
CREATININE: 0.59 mg/dL — AB (ref 0.61–1.24)
Chloride: 95 mmol/L — ABNORMAL LOW (ref 101–111)
GFR calc Af Amer: >60 mL/min (ref >60)
GLUCOSE: 220 mg/dL — AB (ref 65–99)
Potassium: 4 mmol/L (ref 3.5–5.1)
SODIUM: 136 mmol/L (ref 135–145)

## 2015-08-13 LAB — HEMOGLOBIN A1C
HEMOGLOBIN A1C: 6.5 % — AB (ref 4.8–5.6)
Mean Plasma Glucose: 140 mg/dL

## 2015-08-13 MED ORDER — ONDANSETRON HCL 4 MG/2ML IJ SOLN
4.0000 mg | Freq: Four times a day (QID) | INTRAMUSCULAR | Status: DC | PRN
  Administered 2015-08-13: 4 mg via INTRAVENOUS
  Filled 2015-08-13: qty 2

## 2015-08-13 NOTE — Progress Notes (Signed)
RT able to wean pts. llpm/flow down to 7 lpm from wall oxygen meter setting, tolerating well, RT to monitor

## 2015-08-13 NOTE — Progress Notes (Signed)
[facility]                                                                              Patient Demographics  Patrick Hobbs, is a 82 y.o. male, DOB - June 08, 1933, [id number]  Admit date - 08/07/2015   Admitting Physician Geradine Girt, DO  Outpatient Primary MD for the patient is [facility]  LOS - 6  days    Chief Complaint  Patient presents with  . Shortness of Breath       Brief HPI   82 y.o. male With PMHx of COPD- 3-4L O2 dependant, CHF (unknown kind), and current tobacco use (1 cigarette per day). He was recently seen and d/c'd from [facility] after having the flu/COPD exacerbation. Per wife, patient did "okay" upon discharge. He has not felt well for about 3 months. Grandson said he did better while on the steroid taper. He comes in today via EMS from [facility] with c/o fatigue, weakness, and worsening SOB. He was hypoxic when EMS arrived on his 4L O2 into the 80s. Chest x-ray with right lower lobe pneumonia, and he was admitted and started on treatment for healthcare associated pneumonia.   Assessment & Plan     Healthcare associated pneumonia, acute on chronic respiratory failure with hypoxia, chronically on 3-4 L O2 at home - Weaned off from [place], currently on O2 7 L via nasal cannula.  - Continue cefepime, scheduled nebs, IV Lasix, IV Solu-Medrol, Pulmicort, Brovana. DC vancomycin today -  Lactic acidosis has resolved - Blood cultures and sputum cultures are negative so far - Urine legionella antigen, urine strep antigen negative - Steroids tapered, appreciate pulmonology recommendations   Acute on chronic respiratory failure with hypoxia in the setting of COPD exacerbation, HCAP, recent influenza - patient with long-standing tobacco abuse suspect underlying COPD and chronic respiratory failure at home -  Continue Pulmicort, continue scheduled Duonebs, continue IV steroids, Brovana - CTA chest: NO PE,  b/l lower lobe consolidation, right mid lung infiltrates, b/l pleural effusions, rt>left, placed on diuresis - 2-D echo 3/13 had shown EF of 60-65%, negative balance of 1.63 L, continue Lasix    Questionable A flutter - EKGs reviewed, there are 2 EKGs obtained on admission during respiratory distress, EKGs show a flutter, however lead III looks to be in sinus rhythm, NSR on telemetry   Tobacco abuse - Patient counseled on smoking cessation   Anxiety - low dose lorazepam  Code Status: DNR  Family Communication: Discussed in detail with the patient, all imaging results, lab results explained to the patient    Disposition Plan:  hopefully DC in next 1 or 2 days if O2 weaned down to baseline   Time Spent in minutes   25 minutes  Procedures  Chest x-ray  2-D echo CT angiogram chest  Consults   Pulmonology  DVT Prophylaxis  Lovenox   Medications  Scheduled Meds: . arformoterol  15 mcg Nebulization BID  . budesonide (PULMICORT) nebulizer solution  0.5 mg Nebulization BID  . ceFEPime (MAXIPIME) IV  1 g Intravenous 3 times per day  . enoxaparin (LOVENOX) injection  40 mg Subcutaneous Q24H  . feeding supplement (ENSURE ENLIVE)  237 mL Oral TID BM  . furosemide  40 mg Intravenous Daily  . insulin aspart  0-5 Units Subcutaneous QHS  . insulin aspart  0-9 Units Subcutaneous TID WC  . ipratropium-albuterol  3 mL Nebulization Q6H WA  . methylPREDNISolone (SOLU-MEDROL) injection  40 mg Intravenous Q12H  . multivitamin with minerals  1 tablet Oral Daily   Continuous Infusions:  PRN Meds:.albuterol, LORazepam   Antibiotics   Anti-infectives    Start     Dose/Rate Route Frequency Ordered Stop   08/09/15 0600  vancomycin (VANCOCIN) IVPB 1000 mg/200 mL premix  Status:  Discontinued     1,000 mg 200 mL/hr over 60 Minutes Intravenous Every 12 hours 08/09/15 0534 08/13/15 0910   08/08/15 0600  vancomycin (VANCOCIN) 500 mg in sodium chloride 0.9 % 100 mL IVPB  Status:  Discontinued      500 mg 100 mL/hr over 60 Minutes Intravenous Every 12 hours 08/07/15 1628 08/09/15 0534   08/07/15 2200  ceFEPIme (MAXIPIME) 1 g in dextrose 5 % 50 mL IVPB     1 g 100 mL/hr over 30 Minutes Intravenous 3 times per day 08/07/15 1628     08/07/15 1615  vancomycin (VANCOCIN) IVPB 1000 mg/200 mL premix     1,000 mg 200 mL/hr over 60 Minutes Intravenous  Once 08/07/15 1607 08/07/15 1933   08/07/15 1545  ceFEPIme (MAXIPIME) 1 g in dextrose 5 % 50 mL IVPB     1 g 100 mL/hr over 30 Minutes Intravenous  Once 08/07/15 1536 08/07/15 1656        Subjective:   Patrick Hobbs was seen and examined today.  shortness of breath is improving, Ventimask and weaned off, currently on O2 7 L via nasal cannula. No fevers or chills.  Patient denies dizziness, chest pain, abdominal pain, N/V/D/C, new weakness, numbess, tingling. No acute events overnight.    Objective:   Filed Vitals:   08/13/15 0425 08/13/15 0803 08/13/15 0825 08/13/15 1000  BP: 113/68  133/65   Pulse: 76  80   Temp: 98.2 F (36.8 C)  98 F (36.7 C)   TempSrc: Oral     Resp: 15  15   Height:    5\' 8"  (1.727 m)  Weight:      SpO2: 94% 93% 88%     Intake/Output Summary (Last 24 hours) at 08/13/15 1127 Last data filed at 08/13/15 0544  Gross per 24 hour  Intake    560 ml  Output   1080 ml  Net   -520 ml     Wt Readings from Last 3 Encounters:  08/13/15 56.2 kg (123 lb 14.4 oz)  07/21/15 59.3 kg (130 lb 11.7 oz)     Exam  General: Alert and oriented x 3, NAD  HEENT:   Neck:   CVS: S1 S2 clear, RRR  RespiratoryScattered rhonchi, wheezing improving  Abdomen : Soft, nontender, nondistended, + bowel sounds  Ext: no cyanosis clubbing or edema  Neuro: no new deficits  Skin: No rashes  Psych: Normal affect and demeanor, alert and oriented x3    Data Reviewed:  I have personally reviewed following labs and imaging studies  Micro Results Recent Results (from the past 240 hour(s))  Culture, blood (routine x  2)     Status: None   Collection Time: 08/07/15  3:45 PM  Result Value Ref Range Status   Specimen Description BLOOD RIGHT ARM  Final  Special Requests BOTTLES DRAWN AEROBIC AND ANAEROBIC 5CC  Final   Culture NO GROWTH 5 DAYS  Final   Report Status 08/12/2015 FINAL  Final  Culture, blood (routine x 2)     Status: None   Collection Time: 08/07/15  4:00 PM  Result Value Ref Range Status   Specimen Description BLOOD RIGHT HAND  Final   Special Requests BOTTLES DRAWN AEROBIC AND ANAEROBIC 5CC  Final   Culture NO GROWTH 5 DAYS  Final   Report Status 08/12/2015 FINAL  Final    Radiology Reports Ct Angio Chest Pe W/cm &/or Wo Cm  08/11/2015  CLINICAL DATA:  Fatigue, weakness, worsening shortness of breath, oxygen desaturation in 80s, RIGHT lower lobe pneumonia, COPD, hypertension, CHF, smoker, history prostate cancer EXAM: CT ANGIOGRAPHY CHEST WITH CONTRAST TECHNIQUE: Multidetector CT imaging of the chest was performed using the standard protocol during bolus administration of intravenous contrast. Multiplanar CT image reconstructions and MIPs were obtained to evaluate the vascular anatomy. CONTRAST:  15mL OMNIPAQUE IOHEXOL 350 MG/ML SOLN IV COMPARISON:  None FINDINGS: Scattered atherosclerotic calcifications aorta, proximal great vessels and coronary arteries. Upper normal caliber ascending thoracic aorta 3.8 cm diameter image 48. No aortic dissection. Pulmonary arteries well opacified and grossly patent. No evidence of pulmonary embolism. Minimally prominent precarinal lymph node 11 mm short axis image 40. Additional scattered normal sized mediastinal and hilar nodes. Large cyst upper pole LEFT kidney 6.0 x 5.5 cm image 110. Stomach incompletely distended, unable to exclude gastric wall thickening. Remaining visualized upper abdomen unremarkable. BILATERAL pleural effusions RIGHT greater than LEFT. BILATERAL lower lobe consolidation and volume loss greater on RIGHT. Severe underlying emphysematous  changes with mild infiltrate in the posterior RIGHT upper lobe. Scattered central peribronchial thickening. No pneumothorax or definite mass/nodule. Bones demineralized. Review of the MIP images confirms the above findings. IMPRESSION: No evidence of pulmonary embolism. COPD changes with BILATERAL lower lobe consolidation and volume loss RIGHT greater than LEFT with additional mild infiltrate in posterior RIGHT upper lobe. BILATERAL pleural effusions RIGHT greater than LEFT. Large LEFT renal cyst. Electronically Signed   By: Lavonia Dana M.D.   On: 08/11/2015 13:46   Dg Chest Portable 1 View  08/12/2015  CLINICAL DATA:  Hypoxia. EXAM: PORTABLE CHEST 1 VIEW COMPARISON:  08/11/2015, 311 1,017 FINDINGS: Lungs are mildly hyperinflated. Emphysematous changes are present. There has been some improvement in aeration of the lung bases, consistent with resolving edema. IMPRESSION: 1. Improving aeration. 2. Persistent bibasilar opacities. 3. Emphysema. Electronically Signed   By: Nolon Nations M.D.   On: 08/12/2015 11:06   Dg Chest Portable 1 View  08/07/2015  CLINICAL DATA:  82 year old male with progressive shortness of breath EXAM: PORTABLE CHEST 1 VIEW COMPARISON:  Prior chest x-ray 07/20/2015 FINDINGS: New patchy airspace opacity throughout the right lower lobe with thickening along the minor fissure. The cardiac and mediastinal contours are within normal limits. The left lung remains relatively well aerated. Background of emphysema and chronic bronchitic changes is similar compared to prior. No pleural effusion or pneumothorax. No acute osseous abnormality. IMPRESSION: Interval development of patchy right lower lobe airspace opacity concerning for bronchopneumonia. Electronically Signed   By: Jacqulynn Cadet M.D.   On: 08/07/2015 15:23   Dg Chest Port 1 View  07/20/2015  CLINICAL DATA:  Increasing shortness of breath since this morning. Cough and fever. EXAM: PORTABLE CHEST 1 VIEW COMPARISON:  Frontal and  lateral views 09/04/2014 FINDINGS: Stable hyperinflation. There is biapical pleural parenchymal scarring. The cardiomediastinal contours are  unchanged, heart normal in size. The lungs are clear. Pulmonary vasculature is normal. No consolidation, pleural effusion, or pneumothorax. No acute osseous abnormalities are seen. IMPRESSION: Hyperinflation.  No superimposed acute process. Electronically Signed   By: Jeb Levering M.D.   On: 07/20/2015 23:40    CBC  Recent Labs Lab 08/07/15 1443 08/08/15 0701 08/11/15 0420 08/12/15 0530 08/13/15 0530  WBC 9.2 7.3 6.3 8.9 11.5*  HGB 15.3 13.9 13.1 13.8 13.5  HCT 45.8 42.9 40.9 41.0 41.6  PLT 236 233 232 221 206  MCV 90.5 90.9 92.5 91.9 91.2  MCH 30.2 29.4 29.6 30.9 29.6  MCHC 33.4 32.4 32.0 33.7 32.5  RDW 15.0 15.2 14.5 14.2 14.1  LYMPHSABS 0.6*  --   --   --   --   MONOABS 0.5  --   --   --   --   EOSABS 0.0  --   --   --   --   BASOSABS 0.0  --   --   --   --     Chemistries   Recent Labs Lab 08/07/15 1443 08/08/15 0701 08/11/15 0420 08/12/15 0530 08/13/15 0530  NA 136 139 140 138 136  K 4.0 4.2 3.8 4.0 4.0  CL 93* 99* 101 98* 95*  CO2 27 28 32 33* 36*  GLUCOSE 112* 180* 216* 231* 220*  BUN 43* 41* 36* 35* 36*  CREATININE 0.78 0.58* 0.49* 0.57* 0.59*  CALCIUM 9.1 8.7* 8.8* 8.7* 8.6*   ------------------------------------------------------------------------------------------------------------------ estimated creatinine clearance is 59.5 mL/min (by C-G formula based on Cr of 0.59). ------------------------------------------------------------------------------------------------------------------  Recent Labs  08/12/15 0840  HGBA1C 6.5*   ------------------------------------------------------------------------------------------------------------------ No results for input(s): CHOL, HDL, LDLCALC, TRIG, CHOLHDL, LDLDIRECT in the last 72  hours. ------------------------------------------------------------------------------------------------------------------ No results for input(s): TSH, T4TOTAL, T3FREE, THYROIDAB in the last 72 hours.  Invalid input(s): FREET3 ------------------------------------------------------------------------------------------------------------------ No results for input(s): VITAMINB12, FOLATE, FERRITIN, TIBC, IRON, RETICCTPCT in the last 72 hours.  Coagulation profile No results for input(s): INR, PROTIME in the last 168 hours.  No results for input(s): DDIMER in the last 72 hours.  Cardiac Enzymes No results for input(s): CKMB, TROPONINI, MYOGLOBIN in the last 168 hours.  Invalid input(s): CK ------------------------------------------------------------------------------------------------------------------ Invalid input(s): [place]  08/12/15 0737 08/12/15 1112 08/12/15 1628 08/12/15 2151 08/13/15 1107  GLUCAP 269* 196* 235* 250* 141*     Fareed Fung M.D. [facility] 08/13/2015, 11:27 AM  Pager: [phone] Between 7am to 7pm - call Pager - [phone]  After 7pm go to [email or web address] - password [email or web address]  Call night coverage person covering after 7pm

## 2015-08-13 NOTE — Plan of Care (Signed)
Problem: Phase II Progression Outcomes Goal: O2 sats > equal to 90% on RA or at baseline Outcome: Progressing Patient currently on HFNC; has been weaned down to 7 liters on this shift. Patient has had no dyspnea and has maintained O2 sats >90%.

## 2015-08-13 NOTE — Care Management Note (Addendum)
Case Management Note  Patient Details  Name: Patrick Hobbs MRN: [id number] Date of Birth: 1933-08-14  Subjective/Objective: Pt admitted for H CAP. Patient is from home with spouse. Has other family support. Patient has DME 02: provider is [employer].                   Action/Plan: CM will continue to monitor for disposition needs. Pt may benefit from PT/OT consult before d/c and possible HHRN.    Expected Discharge Date:                  Expected Discharge Plan:  [place]  In-House Referral:   Clinical Social Worker  Discharge planning Services  CM Consult  Post Acute Care Choice:   N/A Choice offered to:   N/A  DME Arranged:   N/A DME Agency:   N/A  HH Arranged:   N/A HH Agency:   N/A  Status of Service: Completed.  Medicare Important Message Given:  Yes Date Medicare IM Given:    Medicare IM give by:    Date Additional Medicare IM Given:    Additional Medicare Important Message give by:     If discussed at [place] of Stay Meetings, dates discussed:    Additional Comments: 1231 08-16-15 Jacqlyn Krauss, RN, BSN [phone] Plan for d/c to [facility]. No further needs from CM at this time.  Bethena Roys, RN 08/13/2015, 4:13 PM

## 2015-08-13 NOTE — Care Management Important Message (Signed)
Important Message  Patient Details  Name: Monish Willner MRN: [id number] Date of Birth: 09/07/1933   Medicare Important Message Given:  Yes    Nataliah Hatlestad P Tyshell Ramberg 08/13/2015, 2:51 PM

## 2015-08-14 LAB — BASIC METABOLIC PANEL
Anion gap: 10 (ref 5–15)
BUN: 40 mg/dL — AB (ref 6–20)
CALCIUM: 8.6 mg/dL — AB (ref 8.9–10.3)
CHLORIDE: 94 mmol/L — AB (ref 101–111)
CO2: 33 mmol/L — AB (ref 22–32)
CREATININE: 0.68 mg/dL (ref 0.61–1.24)
GFR calc Af Amer: >60 mL/min (ref >60)
GFR calc non Af Amer: >60 mL/min (ref >60)
Glucose, Bld: 181 mg/dL — ABNORMAL HIGH (ref 65–99)
Potassium: 3.5 mmol/L (ref 3.5–5.1)
SODIUM: 137 mmol/L (ref 135–145)

## 2015-08-14 LAB — CBC
HCT: 41.1 % (ref 39.0–52.0)
Hemoglobin: 13.2 g/dL (ref 13.0–17.0)
MCH: 29.5 pg (ref 26.0–34.0)
MCHC: 32.1 g/dL (ref 30.0–36.0)
MCV: 91.7 fL (ref 78.0–100.0)
PLATELETS: 185 10*3/uL (ref 150–400)
RBC: 4.48 MIL/uL (ref 4.22–5.81)
RDW: 14.1 % (ref 11.5–15.5)
WBC: 15.5 10*3/uL — AB (ref 4.0–10.5)

## 2015-08-14 LAB — GLUCOSE, CAPILLARY
GLUCOSE-CAPILLARY: 228 mg/dL — AB (ref 65–99)
GLUCOSE-CAPILLARY: 178 mg/dL — AB (ref 65–99)
GLUCOSE-CAPILLARY: 177 mg/dL — AB (ref 65–99)
Glucose-Capillary: 132 mg/dL — ABNORMAL HIGH (ref 65–99)
Glucose-Capillary: 154 mg/dL — ABNORMAL HIGH (ref 65–99)

## 2015-08-14 NOTE — Progress Notes (Signed)
[facility]                                                                              Patient Demographics  Patrick Hobbs, is a 82 y.o. male, DOB - 04-25-1934, [id number]  Admit date - 08/07/2015   Admitting Physician Geradine Girt, DO  Outpatient Primary MD for the patient is [facility]  LOS - 7  days    Chief Complaint  Patient presents with  . Shortness of Breath       Brief HPI   82 y.o. male With PMHx of COPD- 3-4L O2 dependant, CHF (unknown kind), and current tobacco use (1 cigarette per day). He was recently seen and d/c'd from [facility] after having the flu/COPD exacerbation. Per wife, patient did "okay" upon discharge. He has not felt well for about 3 months. Grandson said he did better while on the steroid taper. He comes in today via EMS from [facility] with c/o fatigue, weakness, and worsening SOB. He was hypoxic when EMS arrived on his 4L O2 into the 80s. Chest x-ray with right lower lobe pneumonia, and he was admitted and started on treatment for healthcare associated pneumonia.   Assessment & Plan     Healthcare associated pneumonia, acute on chronic respiratory failure with hypoxia, chronically on 3-4 L O2 at home - Weaned off from [place], currently on O2 8 L via nasal cannula. , Unable to wean O2 down further - Continue cefepime, scheduled nebs, IV Lasix, IV Solu-Medrol, Pulmicort, Brovana. Vancomycin discontinued 3/17 -  Lactic acidosis has resolved, Blood cultures and sputum cultures are negative so far - Urine legionella antigen, urine strep antigen negative - Steroids tapered, appreciate pulmonology recommendations , recommended palliative medicine - Palliative medicine consulted for goals of care  Acute on chronic respiratory failure with hypoxia in the setting of COPD exacerbation, HCAP, recent influenza - patient with long-standing tobacco abuse suspect underlying COPD and chronic  respiratory failure at home -  Continue Pulmicort, continue scheduled Duonebs, continue IV steroids, Brovana - CTA chest: NO PE, b/l lower lobe consolidation, right mid lung infiltrates, b/l pleural effusions, rt>left, placed on diuresis - 2-D echo 3/13 had shown EF of 60-65%, negative balance of 1.63 L, continue Lasix  - Palliative medicine consulted   Questionable A flutter - EKGs reviewed, there are 2 EKGs obtained on admission during respiratory distress, EKGs show a flutter, however lead III looks to be in sinus rhythm, NSR on telemetry   Tobacco abuse - Patient counseled on smoking cessation   Anxiety - low dose lorazepam  Code Status: DNR  Family Communication: Discussed in detail with the patient, all imaging results, lab results explained to the patient    Disposition Plan:  Once closer to his baseline  Time Spent in minutes   15 minutes  Procedures  Chest x-ray  2-D echo CT angiogram chest  Consults   Pulmonology  DVT Prophylaxis  Lovenox   Medications  Scheduled Meds: . arformoterol  15 mcg Nebulization BID  . budesonide (PULMICORT) nebulizer solution  0.5 mg Nebulization BID  . ceFEPime (MAXIPIME) IV  1 g  Intravenous 3 times per day  . enoxaparin (LOVENOX) injection  40 mg Subcutaneous Q24H  . feeding supplement (ENSURE ENLIVE)  237 mL Oral TID BM  . furosemide  40 mg Intravenous Daily  . insulin aspart  0-5 Units Subcutaneous QHS  . insulin aspart  0-9 Units Subcutaneous TID WC  . ipratropium-albuterol  3 mL Nebulization Q6H WA  . methylPREDNISolone (SOLU-MEDROL) injection  40 mg Intravenous Q12H  . multivitamin with minerals  1 tablet Oral Daily   Continuous Infusions:  PRN Meds:.albuterol, LORazepam, ondansetron (ZOFRAN) IV   Antibiotics   Anti-infectives    Start     Dose/Rate Route Frequency Ordered Stop   08/09/15 0600  vancomycin (VANCOCIN) IVPB 1000 mg/200 mL premix  Status:  Discontinued     1,000 mg 200 mL/hr over 60 Minutes  Intravenous Every 12 hours 08/09/15 0534 08/13/15 0910   08/08/15 0600  vancomycin (VANCOCIN) 500 mg in sodium chloride 0.9 % 100 mL IVPB  Status:  Discontinued     500 mg 100 mL/hr over 60 Minutes Intravenous Every 12 hours 08/07/15 1628 08/09/15 0534   08/07/15 2200  ceFEPIme (MAXIPIME) 1 g in dextrose 5 % 50 mL IVPB     1 g 100 mL/hr over 30 Minutes Intravenous 3 times per day 08/07/15 1628     08/07/15 1615  vancomycin (VANCOCIN) IVPB 1000 mg/200 mL premix     1,000 mg 200 mL/hr over 60 Minutes Intravenous  Once 08/07/15 1607 08/07/15 1933   08/07/15 1545  ceFEPIme (MAXIPIME) 1 g in dextrose 5 % 50 mL IVPB     1 g 100 mL/hr over 30 Minutes Intravenous  Once 08/07/15 1536 08/07/15 1656        Subjective:   Patrick Hobbs was seen and examined today. No significant improvement, still having shortness of breath, O2 8 L. No fevers or chills.  Patient denies dizziness, chest pain, abdominal pain, N/V/D/C, new weakness, numbess, tingling  Objective:   Filed Vitals:   08/14/15 0820 08/14/15 0825 08/14/15 0830 08/14/15 1222  BP:    122/67  Pulse:    84  Temp:    98.3 F (36.8 C)  TempSrc:    Oral  Resp:    19  Height:      Weight:      SpO2: 96% 94% 93%     Intake/Output Summary (Last 24 hours) at 08/14/15 1300 Last data filed at 08/14/15 1250  Gross per 24 hour  Intake    300 ml  Output    800 ml  Net   -500 ml     Wt Readings from Last 3 Encounters:  08/14/15 54.7 kg (120 lb 9.5 oz)  07/21/15 59.3 kg (130 lb 11.7 oz)     Exam  General: Alert and oriented x 3, NAD  HEENT:   Neck:   CVS: S1 S2 clear, RRR  Respiratory: Scattered rhonchi, wheezing  Abdomen : Soft, nontender, nondistended, + bowel sounds  Ext: no cyanosis clubbing or edema  Neuro: no new deficits  Skin: No rashes  Psych: Normal affect and demeanor, alert and oriented x3    Data Reviewed:  I have personally reviewed following labs and imaging studies  Micro Results Recent Results  (from the past 240 hour(s))  Culture, blood (routine x 2)     Status: None   Collection Time: 08/07/15  3:45 PM  Result Value Ref Range Status   Specimen Description BLOOD RIGHT ARM  Final   Special Requests BOTTLES  DRAWN AEROBIC AND ANAEROBIC 5CC  Final   Culture NO GROWTH 5 DAYS  Final   Report Status 08/12/2015 FINAL  Final  Culture, blood (routine x 2)     Status: None   Collection Time: 08/07/15  4:00 PM  Result Value Ref Range Status   Specimen Description BLOOD RIGHT HAND  Final   Special Requests BOTTLES DRAWN AEROBIC AND ANAEROBIC 5CC  Final   Culture NO GROWTH 5 DAYS  Final   Report Status 08/12/2015 FINAL  Final    Radiology Reports Ct Angio Chest Pe W/cm &/or Wo Cm  08/11/2015  CLINICAL DATA:  Fatigue, weakness, worsening shortness of breath, oxygen desaturation in 80s, RIGHT lower lobe pneumonia, COPD, hypertension, CHF, smoker, history prostate cancer EXAM: CT ANGIOGRAPHY CHEST WITH CONTRAST TECHNIQUE: Multidetector CT imaging of the chest was performed using the standard protocol during bolus administration of intravenous contrast. Multiplanar CT image reconstructions and MIPs were obtained to evaluate the vascular anatomy. CONTRAST:  159mL OMNIPAQUE IOHEXOL 350 MG/ML SOLN IV COMPARISON:  None FINDINGS: Scattered atherosclerotic calcifications aorta, proximal great vessels and coronary arteries. Upper normal caliber ascending thoracic aorta 3.8 cm diameter image 48. No aortic dissection. Pulmonary arteries well opacified and grossly patent. No evidence of pulmonary embolism. Minimally prominent precarinal lymph node 11 mm short axis image 40. Additional scattered normal sized mediastinal and hilar nodes. Large cyst upper pole LEFT kidney 6.0 x 5.5 cm image 110. Stomach incompletely distended, unable to exclude gastric wall thickening. Remaining visualized upper abdomen unremarkable. BILATERAL pleural effusions RIGHT greater than LEFT. BILATERAL lower lobe consolidation and volume  loss greater on RIGHT. Severe underlying emphysematous changes with mild infiltrate in the posterior RIGHT upper lobe. Scattered central peribronchial thickening. No pneumothorax or definite mass/nodule. Bones demineralized. Review of the MIP images confirms the above findings. IMPRESSION: No evidence of pulmonary embolism. COPD changes with BILATERAL lower lobe consolidation and volume loss RIGHT greater than LEFT with additional mild infiltrate in posterior RIGHT upper lobe. BILATERAL pleural effusions RIGHT greater than LEFT. Large LEFT renal cyst. Electronically Signed   By: Lavonia Dana M.D.   On: 08/11/2015 13:46   Dg Chest Portable 1 View  08/12/2015  CLINICAL DATA:  Hypoxia. EXAM: PORTABLE CHEST 1 VIEW COMPARISON:  08/11/2015, 311 1,017 FINDINGS: Lungs are mildly hyperinflated. Emphysematous changes are present. There has been some improvement in aeration of the lung bases, consistent with resolving edema. IMPRESSION: 1. Improving aeration. 2. Persistent bibasilar opacities. 3. Emphysema. Electronically Signed   By: Nolon Nations M.D.   On: 08/12/2015 11:06   Dg Chest Portable 1 View  08/07/2015  CLINICAL DATA:  82 year old male with progressive shortness of breath EXAM: PORTABLE CHEST 1 VIEW COMPARISON:  Prior chest x-ray 07/20/2015 FINDINGS: New patchy airspace opacity throughout the right lower lobe with thickening along the minor fissure. The cardiac and mediastinal contours are within normal limits. The left lung remains relatively well aerated. Background of emphysema and chronic bronchitic changes is similar compared to prior. No pleural effusion or pneumothorax. No acute osseous abnormality. IMPRESSION: Interval development of patchy right lower lobe airspace opacity concerning for bronchopneumonia. Electronically Signed   By: Jacqulynn Cadet M.D.   On: 08/07/2015 15:23   Dg Chest Port 1 View  07/20/2015  CLINICAL DATA:  Increasing shortness of breath since this morning. Cough and fever.  EXAM: PORTABLE CHEST 1 VIEW COMPARISON:  Frontal and lateral views 09/04/2014 FINDINGS: Stable hyperinflation. There is biapical pleural parenchymal scarring. The cardiomediastinal contours are unchanged, heart normal  in size. The lungs are clear. Pulmonary vasculature is normal. No consolidation, pleural effusion, or pneumothorax. No acute osseous abnormalities are seen. IMPRESSION: Hyperinflation.  No superimposed acute process. Electronically Signed   By: Jeb Levering M.D.   On: 07/20/2015 23:40    CBC  Recent Labs Lab 08/07/15 1443 08/08/15 0701 08/11/15 0420 08/12/15 0530 08/13/15 0530 08/14/15 0607  WBC 9.2 7.3 6.3 8.9 11.5* 15.5*  HGB 15.3 13.9 13.1 13.8 13.5 13.2  HCT 45.8 42.9 40.9 41.0 41.6 41.1  PLT 236 233 232 221 206 185  MCV 90.5 90.9 92.5 91.9 91.2 91.7  MCH 30.2 29.4 29.6 30.9 29.6 29.5  MCHC 33.4 32.4 32.0 33.7 32.5 32.1  RDW 15.0 15.2 14.5 14.2 14.1 14.1  LYMPHSABS 0.6*  --   --   --   --   --   MONOABS 0.5  --   --   --   --   --   EOSABS 0.0  --   --   --   --   --   BASOSABS 0.0  --   --   --   --   --     Chemistries   Recent Labs Lab 08/08/15 0701 08/11/15 0420 08/12/15 0530 08/13/15 0530 08/14/15 0607  NA 139 140 138 136 137  K 4.2 3.8 4.0 4.0 3.5  CL 99* 101 98* 95* 94*  CO2 28 32 33* 36* 33*  GLUCOSE 180* 216* 231* 220* 181*  BUN 41* 36* 35* 36* 40*  CREATININE 0.58* 0.49* 0.57* 0.59* 0.68  CALCIUM 8.7* 8.8* 8.7* 8.6* 8.6*   ------------------------------------------------------------------------------------------------------------------ estimated creatinine clearance is 57.9 mL/min (by C-G formula based on Cr of 0.68). ------------------------------------------------------------------------------------------------------------------  Recent Labs  08/12/15 0840  HGBA1C 6.5*   ------------------------------------------------------------------------------------------------------------------ No results for input(s): CHOL, HDL, LDLCALC,  TRIG, CHOLHDL, LDLDIRECT in the last 72 hours. ------------------------------------------------------------------------------------------------------------------ No results for input(s): TSH, T4TOTAL, T3FREE, THYROIDAB in the last 72 hours.  Invalid input(s): FREET3 ------------------------------------------------------------------------------------------------------------------ No results for input(s): VITAMINB12, FOLATE, FERRITIN, TIBC, IRON, RETICCTPCT in the last 72 hours.  Coagulation profile No results for input(s): INR, PROTIME in the last 168 hours.  No results for input(s): DDIMER in the last 72 hours.  Cardiac Enzymes No results for input(s): CKMB, TROPONINI, MYOGLOBIN in the last 168 hours.  Invalid input(s): CK ------------------------------------------------------------------------------------------------------------------ Invalid input(s): [place]  08/12/15 2151 08/13/15 1107 08/13/15 1632 08/13/15 2143 08/14/15 0803 08/14/15 [place] M.D. [facility] 08/14/2015, 1:00 PM  Pager: [phone] Between 7am to 7pm - call Pager - [phone]  After 7pm go to [email or web address] - password [email or web address]  Call night coverage person covering after 7pm

## 2015-08-15 DIAGNOSIS — Z515 Encounter for palliative care: Secondary | ICD-10-CM | POA: Insufficient documentation

## 2015-08-15 LAB — BASIC METABOLIC PANEL
Anion gap: 7 (ref 5–15)
BUN: 42 mg/dL — AB (ref 6–20)
CALCIUM: 8.4 mg/dL — AB (ref 8.9–10.3)
CHLORIDE: 95 mmol/L — AB (ref 101–111)
CO2: 36 mmol/L — AB (ref 22–32)
CREATININE: 0.63 mg/dL (ref 0.61–1.24)
GFR calc Af Amer: >60 mL/min (ref >60)
GFR calc non Af Amer: >60 mL/min (ref >60)
GLUCOSE: 164 mg/dL — AB (ref 65–99)
Potassium: 3.4 mmol/L — ABNORMAL LOW (ref 3.5–5.1)
Sodium: 138 mmol/L (ref 135–145)

## 2015-08-15 LAB — GLUCOSE, CAPILLARY
GLUCOSE-CAPILLARY: 218 mg/dL — AB (ref 65–99)
GLUCOSE-CAPILLARY: 152 mg/dL — AB (ref 65–99)
Glucose-Capillary: 164 mg/dL — ABNORMAL HIGH (ref 65–99)
Glucose-Capillary: 181 mg/dL — ABNORMAL HIGH (ref 65–99)

## 2015-08-15 LAB — MAGNESIUM: Magnesium: 2.3 mg/dL (ref 1.7–2.4)

## 2015-08-15 MED ORDER — POTASSIUM CHLORIDE CRYS ER 20 MEQ PO TBCR
40.0000 meq | EXTENDED_RELEASE_TABLET | Freq: Once | ORAL | Status: AC
  Administered 2015-08-15: 40 meq via ORAL
  Filled 2015-08-15: qty 2

## 2015-08-15 MED ORDER — CALCIUM CARBONATE ANTACID 500 MG PO CHEW
400.0000 mg | CHEWABLE_TABLET | Freq: Once | ORAL | Status: AC
  Administered 2015-08-15: 400 mg via ORAL
  Filled 2015-08-15: qty 2

## 2015-08-15 NOTE — Progress Notes (Signed)
[facility]                                                                              Patient Demographics  Patrick Hobbs, is a 82 y.o. male, DOB - 04-02-1934, [id number]  Admit date - 08/07/2015   Admitting Physician Geradine Girt, DO  Outpatient Primary MD for the patient is [facility]  LOS - 8  days    Chief Complaint  Patient presents with  . Shortness of Breath       Brief HPI   82 y.o. male With PMHx of COPD- 3-4L O2 dependant, CHF (unknown kind), and current tobacco use (1 cigarette per day). He was recently seen and d/c'd from [facility] after having the flu/COPD exacerbation. Per wife, patient did "okay" upon discharge. He has not felt well for about 3 months. Grandson said he did better while on the steroid taper. He comes in today via EMS from [facility] with c/o fatigue, weakness, and worsening SOB. He was hypoxic when EMS arrived on his 4L O2 into the 80s. Chest x-ray with right lower lobe pneumonia, and he was admitted and started on treatment for healthcare associated pneumonia.   Assessment & Plan     Healthcare associated pneumonia, acute on chronic respiratory failure with hypoxia, chronically on 3-4 L O2 at home - Weaned off from [place], however still on high flow O2 7 L, Unable to wean O2 down further - Continue cefepime, scheduled nebs, IV Lasix, IV Solu-Medrol, Pulmicort, Brovana. Vancomycin discontinued 3/17 -  Lactic acidosis has resolved, Blood cultures and sputum cultures are negative so far, Urine legionella antigen, urine strep antigen negative - Steroids tapered, appreciate pulmonology recommendations , recommended palliative medicine - Maximized therapy, unable to wean O2 down further, palliative medicine goals of care pending  Acute on chronic respiratory failure with hypoxia in the setting of COPD exacerbation, HCAP, recent influenza - patient with long-standing tobacco abuse suspect  underlying advanced COPD and chronic respiratory failure at home -  Continue Pulmicort, Duonebs, continue IV steroids, Brovana - CTA chest: NO PE, b/l lower lobe consolidation, right mid lung infiltrates, b/l pleural effusions, rt>left, placed on diuresis - 2-D echo 3/13 had shown EF of 60-65%, negative balance of 1.63 L, continue Lasix  - Palliative medicine consulted, goals of care pending   Questionable A flutter - EKGs reviewed, there are 2 EKGs obtained on admission during respiratory distress, EKGs show a flutter, however lead III looks to be in sinus rhythm, NSR on telemetry   Tobacco abuse - Patient counseled on smoking cessation   Anxiety - low dose lorazepam  Code Status: DNR  Family Communication: Discussed in detail with the patient, all imaging results, lab results explained to the patient    Disposition Plan:  Once closer to his baseline, palliative goals of care pending  Time Spent in minutes   15 minutes  Procedures  Chest x-ray  2-D echo CT angiogram chest  Consults   Pulmonology Palliative medicine  DVT Prophylaxis  Lovenox   Medications  Scheduled Meds: . arformoterol  15 mcg Nebulization BID  . budesonide (PULMICORT)  nebulizer solution  0.5 mg Nebulization BID  . ceFEPime (MAXIPIME) IV  1 g Intravenous 3 times per day  . enoxaparin (LOVENOX) injection  40 mg Subcutaneous Q24H  . feeding supplement (ENSURE ENLIVE)  237 mL Oral TID BM  . furosemide  40 mg Intravenous Daily  . insulin aspart  0-5 Units Subcutaneous QHS  . insulin aspart  0-9 Units Subcutaneous TID WC  . ipratropium-albuterol  3 mL Nebulization Q6H WA  . methylPREDNISolone (SOLU-MEDROL) injection  40 mg Intravenous Q12H  . multivitamin with minerals  1 tablet Oral Daily   Continuous Infusions:  PRN Meds:.albuterol, LORazepam, ondansetron (ZOFRAN) IV   Antibiotics   Anti-infectives    Start     Dose/Rate Route Frequency Ordered Stop   08/09/15 0600  vancomycin (VANCOCIN) IVPB  1000 mg/200 mL premix  Status:  Discontinued     1,000 mg 200 mL/hr over 60 Minutes Intravenous Every 12 hours 08/09/15 0534 08/13/15 0910   08/08/15 0600  vancomycin (VANCOCIN) 500 mg in sodium chloride 0.9 % 100 mL IVPB  Status:  Discontinued     500 mg 100 mL/hr over 60 Minutes Intravenous Every 12 hours 08/07/15 1628 08/09/15 0534   08/07/15 2200  ceFEPIme (MAXIPIME) 1 g in dextrose 5 % 50 mL IVPB     1 g 100 mL/hr over 30 Minutes Intravenous 3 times per day 08/07/15 1628     08/07/15 1615  vancomycin (VANCOCIN) IVPB 1000 mg/200 mL premix     1,000 mg 200 mL/hr over 60 Minutes Intravenous  Once 08/07/15 1607 08/07/15 1933   08/07/15 1545  ceFEPIme (MAXIPIME) 1 g in dextrose 5 % 50 mL IVPB     1 g 100 mL/hr over 30 Minutes Intravenous  Once 08/07/15 1536 08/07/15 1656        Subjective:   Patrick Hobbs was seen and examined today. Still short of breath on minimal exertion, on high flow O2 7 L via nasal cannula.  No fevers or chills.  Patient denies dizziness, chest pain, abdominal pain, N/V/D/C, new weakness, numbess, tingling. Patient getting depressed over being in the hospital, wants to go home.   Objective:   Filed Vitals:   08/15/15 0500 08/15/15 0742 08/15/15 0744 08/15/15 0750  BP:      Pulse:      Temp: 97.8 F (36.6 C)     TempSrc: Oral     Resp:      Height:      Weight: 53.343 kg (117 lb 9.6 oz)     SpO2:  94% 94% 93%    Intake/Output Summary (Last 24 hours) at 08/15/15 1050 Last data filed at 08/15/15 [id number]  Gross per 24 hour  Intake    420 ml  Output    375 ml  Net     45 ml     Wt Readings from Last 3 Encounters:  08/15/15 53.343 kg (117 lb 9.6 oz)  07/21/15 59.3 kg (130 lb 11.7 oz)     Exam  General: Alert and oriented x 3, NAD  HEENT:   Neck:   CVS: S1 S2 clear, RRR  Respiratory: Scattered rhonchi bilaterally   Abdomen : Soft, nontender, nondistended, + bowel sounds  Ext: no cyanosis clubbing or edema  Neuro: no new  deficits  Skin: No rashes  Psych: Normal affect and demeanor, alert and oriented x3    Data Reviewed:  I have personally reviewed following labs and imaging studies  Micro Results Recent Results (from the past  240 hour(s))  Culture, blood (routine x 2)     Status: None   Collection Time: 08/07/15  3:45 PM  Result Value Ref Range Status   Specimen Description BLOOD RIGHT ARM  Final   Special Requests BOTTLES DRAWN AEROBIC AND ANAEROBIC 5CC  Final   Culture NO GROWTH 5 DAYS  Final   Report Status 08/12/2015 FINAL  Final  Culture, blood (routine x 2)     Status: None   Collection Time: 08/07/15  4:00 PM  Result Value Ref Range Status   Specimen Description BLOOD RIGHT HAND  Final   Special Requests BOTTLES DRAWN AEROBIC AND ANAEROBIC 5CC  Final   Culture NO GROWTH 5 DAYS  Final   Report Status 08/12/2015 FINAL  Final    Radiology Reports Ct Angio Chest Pe W/cm &/or Wo Cm  08/11/2015  CLINICAL DATA:  Fatigue, weakness, worsening shortness of breath, oxygen desaturation in 80s, RIGHT lower lobe pneumonia, COPD, hypertension, CHF, smoker, history prostate cancer EXAM: CT ANGIOGRAPHY CHEST WITH CONTRAST TECHNIQUE: Multidetector CT imaging of the chest was performed using the standard protocol during bolus administration of intravenous contrast. Multiplanar CT image reconstructions and MIPs were obtained to evaluate the vascular anatomy. CONTRAST:  130mL OMNIPAQUE IOHEXOL 350 MG/ML SOLN IV COMPARISON:  None FINDINGS: Scattered atherosclerotic calcifications aorta, proximal great vessels and coronary arteries. Upper normal caliber ascending thoracic aorta 3.8 cm diameter image 48. No aortic dissection. Pulmonary arteries well opacified and grossly patent. No evidence of pulmonary embolism. Minimally prominent precarinal lymph node 11 mm short axis image 40. Additional scattered normal sized mediastinal and hilar nodes. Large cyst upper pole LEFT kidney 6.0 x 5.5 cm image 110. Stomach incompletely  distended, unable to exclude gastric wall thickening. Remaining visualized upper abdomen unremarkable. BILATERAL pleural effusions RIGHT greater than LEFT. BILATERAL lower lobe consolidation and volume loss greater on RIGHT. Severe underlying emphysematous changes with mild infiltrate in the posterior RIGHT upper lobe. Scattered central peribronchial thickening. No pneumothorax or definite mass/nodule. Bones demineralized. Review of the MIP images confirms the above findings. IMPRESSION: No evidence of pulmonary embolism. COPD changes with BILATERAL lower lobe consolidation and volume loss RIGHT greater than LEFT with additional mild infiltrate in posterior RIGHT upper lobe. BILATERAL pleural effusions RIGHT greater than LEFT. Large LEFT renal cyst. Electronically Signed   By: Lavonia Dana M.D.   On: 08/11/2015 13:46   Dg Chest Portable 1 View  08/12/2015  CLINICAL DATA:  Hypoxia. EXAM: PORTABLE CHEST 1 VIEW COMPARISON:  08/11/2015, 311 1,017 FINDINGS: Lungs are mildly hyperinflated. Emphysematous changes are present. There has been some improvement in aeration of the lung bases, consistent with resolving edema. IMPRESSION: 1. Improving aeration. 2. Persistent bibasilar opacities. 3. Emphysema. Electronically Signed   By: Nolon Nations M.D.   On: 08/12/2015 11:06   Dg Chest Portable 1 View  08/07/2015  CLINICAL DATA:  82 year old male with progressive shortness of breath EXAM: PORTABLE CHEST 1 VIEW COMPARISON:  Prior chest x-ray 07/20/2015 FINDINGS: New patchy airspace opacity throughout the right lower lobe with thickening along the minor fissure. The cardiac and mediastinal contours are within normal limits. The left lung remains relatively well aerated. Background of emphysema and chronic bronchitic changes is similar compared to prior. No pleural effusion or pneumothorax. No acute osseous abnormality. IMPRESSION: Interval development of patchy right lower lobe airspace opacity concerning for  bronchopneumonia. Electronically Signed   By: Jacqulynn Cadet M.D.   On: 08/07/2015 15:23   Dg Chest Port 1 View  07/20/2015  CLINICAL DATA:  Increasing shortness of breath since this morning. Cough and fever. EXAM: PORTABLE CHEST 1 VIEW COMPARISON:  Frontal and lateral views 09/04/2014 FINDINGS: Stable hyperinflation. There is biapical pleural parenchymal scarring. The cardiomediastinal contours are unchanged, heart normal in size. The lungs are clear. Pulmonary vasculature is normal. No consolidation, pleural effusion, or pneumothorax. No acute osseous abnormalities are seen. IMPRESSION: Hyperinflation.  No superimposed acute process. Electronically Signed   By: Jeb Levering M.D.   On: 07/20/2015 23:40    CBC  Recent Labs Lab 08/11/15 0420 08/12/15 0530 08/13/15 0530 08/14/15 0607  WBC 6.3 8.9 11.5* 15.5*  HGB 13.1 13.8 13.5 13.2  HCT 40.9 41.0 41.6 41.1  PLT 232 221 206 185  MCV 92.5 91.9 91.2 91.7  MCH 29.6 30.9 29.6 29.5  MCHC 32.0 33.7 32.5 32.1  RDW 14.5 14.2 14.1 14.1    Chemistries   Recent Labs Lab 08/11/15 0420 08/12/15 0530 08/13/15 0530 08/14/15 0607 08/15/15 0514  NA 140 138 136 137 138  K 3.8 4.0 4.0 3.5 3.4*  CL 101 98* 95* 94* 95*  CO2 32 33* 36* 33* 36*  GLUCOSE 216* 231* 220* 181* 164*  BUN 36* 35* 36* 40* 42*  CREATININE 0.49* 0.57* 0.59* 0.68 0.63  CALCIUM 8.8* 8.7* 8.6* 8.6* 8.4*   ------------------------------------------------------------------------------------------------------------------ estimated creatinine clearance is 56.4 mL/min (by C-G formula based on Cr of 0.63). ------------------------------------------------------------------------------------------------------------------ No results for input(s): HGBA1C in the last 72 hours. ------------------------------------------------------------------------------------------------------------------ No results for input(s): CHOL, HDL, LDLCALC, TRIG, CHOLHDL, LDLDIRECT in the last 72  hours. ------------------------------------------------------------------------------------------------------------------ No results for input(s): TSH, T4TOTAL, T3FREE, THYROIDAB in the last 72 hours.  Invalid input(s): FREET3 ------------------------------------------------------------------------------------------------------------------ No results for input(s): VITAMINB12, FOLATE, FERRITIN, TIBC, IRON, RETICCTPCT in the last 72 hours.  Coagulation profile No results for input(s): INR, PROTIME in the last 168 hours.  No results for input(s): DDIMER in the last 72 hours.  Cardiac Enzymes No results for input(s): CKMB, TROPONINI, MYOGLOBIN in the last 168 hours.  Invalid input(s): CK ------------------------------------------------------------------------------------------------------------------ Invalid input(s): POCBNP   Recent Labs  08/13/15 2143 08/14/15 0803 08/14/15 1224 08/14/15 1629 08/14/15 2119 08/15/15 [place] M.D. [facility] 08/15/2015, 10:50 AM  Pager: [phone] Between 7am to 7pm - call Pager - [phone]  After 7pm go to [email or web address] - password [email or web address]  Call night coverage person covering after 7pm

## 2015-08-15 NOTE — Progress Notes (Signed)
Pt has been very anxious throughout the day. Anxiet has been relieved with emotional support and PRN ativan. RN will continue to monitor. Pt has kept stating he is ready to go home, which he is hoping it is something they can discuss with the MD tomorrow morning

## 2015-08-15 NOTE — Progress Notes (Signed)
Spoke to pt's wife. Agreed to meet for initial goals of care meeting Monday, 08/16/2015 @ 10am. Thank you, Romona Curls, ANP-ACHPN

## 2015-08-16 DIAGNOSIS — R0600 Dyspnea, unspecified: Secondary | ICD-10-CM

## 2015-08-16 DIAGNOSIS — Z7189 Other specified counseling: Secondary | ICD-10-CM | POA: Insufficient documentation

## 2015-08-16 DIAGNOSIS — Z515 Encounter for palliative care: Secondary | ICD-10-CM

## 2015-08-16 LAB — BASIC METABOLIC PANEL
Anion gap: 8 (ref 5–15)
BUN: 36 mg/dL — AB (ref 6–20)
CALCIUM: 8.7 mg/dL — AB (ref 8.9–10.3)
CHLORIDE: 95 mmol/L — AB (ref 101–111)
CO2: 35 mmol/L — AB (ref 22–32)
Creatinine, Ser: 0.55 mg/dL — ABNORMAL LOW (ref 0.61–1.24)
GFR calc Af Amer: >60 mL/min (ref >60)
GFR calc non Af Amer: >60 mL/min (ref >60)
Glucose, Bld: 165 mg/dL — ABNORMAL HIGH (ref 65–99)
POTASSIUM: 3.7 mmol/L (ref 3.5–5.1)
SODIUM: 138 mmol/L (ref 135–145)

## 2015-08-16 LAB — GLUCOSE, CAPILLARY
GLUCOSE-CAPILLARY: 161 mg/dL — AB (ref 65–99)
Glucose-Capillary: 103 mg/dL — ABNORMAL HIGH (ref 65–99)

## 2015-08-16 MED ORDER — MAGIC MOUTHWASH: 10.0000 mL | Freq: Four times a day (QID) | ORAL | Status: DC

## 2015-08-16 MED ORDER — FUROSEMIDE 40 MG PO TABS: 40.0000 mg | ORAL_TABLET | Freq: Every day | ORAL | Status: AC

## 2015-08-16 MED ORDER — PREDNISONE 10 MG PO TABS: ORAL_TABLET | ORAL | Status: AC

## 2015-08-16 MED ORDER — LORAZEPAM 1 MG PO TABS: 1.0000 mg | ORAL_TABLET | Freq: Four times a day (QID) | ORAL | Status: DC | PRN

## 2015-08-16 MED ORDER — OXYCODONE HCL 5 MG/5ML PO SOLN: 5.0000 mg | Freq: Three times a day (TID) | ORAL | Status: DC

## 2015-08-16 MED ORDER — OXYCODONE HCL 5 MG/5ML PO SOLN: 5.0000 mg | ORAL | Status: DC | PRN

## 2015-08-16 MED ORDER — MAGIC MOUTHWASH
10.0000 mL | Freq: Four times a day (QID) | ORAL | Status: DC
  Administered 2015-08-16: 10 mL via ORAL
  Filled 2015-08-16 (×2): qty 10

## 2015-08-16 MED ORDER — LORAZEPAM 1 MG PO TABS: 1.0000 mg | ORAL_TABLET | Freq: Four times a day (QID) | ORAL | Status: AC | PRN

## 2015-08-16 MED ORDER — OXYCODONE HCL 5 MG/5ML PO SOLN: 5.0000 mg | ORAL | Status: AC | PRN

## 2015-08-16 MED ORDER — OXYCODONE HCL 5 MG/5ML PO SOLN
5.0000 mg | ORAL | Status: DC | PRN
  Administered 2015-08-16: 5 mg via ORAL
  Filled 2015-08-16: qty 5

## 2015-08-16 MED ORDER — MAGIC MOUTHWASH: 10.0000 mL | Freq: Four times a day (QID) | ORAL | Status: AC

## 2015-08-16 NOTE — Progress Notes (Signed)
Nutrition Follow-up  DOCUMENTATION CODES:   Severe malnutrition in context of chronic illness, underweight  INTERVENTION:    Continue to offer Ensure Enlive po BID, each supplement provides 350 kcal and 20 grams of protein  Continue MVI daily  Add chocolate Mighty Shake to meals TID, each supplement provides 500 kcal and 23 gm protein  NUTRITION DIAGNOSIS:   Malnutrition related to chronic illness as evidenced by severe depletion of muscle mass, percent weight loss (18% weight loss within the past 3 months).  Ongoing  GOAL:   Patient will meet greater than or equal to 90% of their needs  Unmet  MONITOR:   PO intake, Supplement acceptance, Labs, Weight trends, I & O's  ASSESSMENT:   82 year old male with PMHx of COPD- 3-4L O2 dependant, CHF (unknown kind), and current tobacco use (1 cigarette per day). He was recently seen and d/c'd from [facility] after having the flu/COPD exacerbation. He has not felt well for about 3 months. He was admitted on 3/11 via EMS from [facility] with c/o fatigue, weakness, and worsening SOB. He was hypoxic when EMS arrived on his 4L O2 into the 80s. He was wheezing at that time as well.  Patient continues to eat poorly, consuming mostly 10-25% of meals. Weight continues to trend down. He wants to go home. He is being offered Ensure Complete TID, but frequently refuses them. Will try Chocolate Mighty Shakes with meal trays.  Diet Order:  Diet regular Room service appropriate?: Yes; Fluid consistency:: Thin  Skin:  Reviewed, no issues  Last BM:  3/13  Height:   Ht Readings from Last 1 Encounters:  08/13/15 5\' 8"  (1.727 m)    Weight:   Wt Readings from Last 1 Encounters:  08/16/15 114 lb (51.71 kg)   08/08/15 119 lb 4.3 oz (54.1 kg)       Ideal Body Weight:  67.3 kg  BMI:  Body mass index is 17.34 kg/(m^2).  Estimated Nutritional Needs:   Kcal:  1800-2000  Protein:  85-100 gm  Fluid:  1.8 L  EDUCATION NEEDS:    No education needs identified at this time  Molli Barrows, [place], [place], [place] Pager [phone] After Hours Pager [phone]

## 2015-08-16 NOTE — Clinical Social Work Note (Signed)
Spoke with patient and family regarding referral for hospice placement. Patient and family would prefer [facility] at discharge. Referral made.   Liz Beach MSW, [place], [place], [phone]

## 2015-08-16 NOTE — Progress Notes (Signed)
Report called to [facility]. Pt transported by [facility].    Ruben Reason, RN

## 2015-08-16 NOTE — Care Management Important Message (Signed)
Important Message  Patient Details  Name: Patrick Hobbs MRN: [id number] Date of Birth: February 08, 1934   Medicare Important Message Given:  Yes    [name] [name] 08/16/2015, 3:42 PM

## 2015-08-16 NOTE — Consult Note (Signed)
Consultation Note Date: 08/16/2015   Patient Name: Patrick Hobbs  DOB: Feb 02, 1934  MRN: [id number]  Age / Sex: 82 y.o., male  PCP: [facility] Referring Physician: Mendel Corning, MD  Reason for Consultation: Establishing goals of care, Hospice Evaluation and Non pain symptom management   Homar Weinkauf is a 82 year old white male with a PMH of CHF, COPD and prostate cancer who presented 08/07/2015 with SOB and admitted for evaluation and treatment of lower lobar pneumonia. Last month he presented to [facility] (07/20/2015) in respiratory distress and admitted to inpatient for an acute COPD exacerbation and sepsis presumably second to influenza virus. He was treated discharged home 07/22/2015 on [facility]. Patient has been at [facility] for 9 days and has received full medical treatment for end stage COPD with exacerbation, including pulmonology input. Unfortunately, Mr. Farve remains on 10LNC and has SpO2= 83% at rest. He is unable to get out of bed and he desaturates with speaking.  He has very low PO intake.   Clinical Assessment/Narrative: We met this morning with the patient, his wife, daughter and son-in-law to discuss goals of care. The daughter made it very clear that the family could not appropriately care for Mr. Espin at home. Based on his O2 requirement and lack of movement, Mr. Ybarbo is not a candidate for SNF. We discussed the disease trajectory of COPD with the family and admitted to expecting a fairly rapid decline based on his present condition. We then broached the distinction between home hospice and residential hospice, but with the family decided that residential hospice would be ideal for Mr. Engen based on the family's limited resources at home.  The family appeared completely blind-sided by the notion that Mr. Follette was not going to return to his "normal" state and that he may pass within 6 weeks.     Contacts/Participants in Discussion: Wife, daughter, patient Primary Decision Maker: Patient    SUMMARY OF RECOMMENDATIONS  Code Status/Advance Care Planning: DNR    Code Status Orders        Start     Ordered   08/07/15 1803  Do not attempt resuscitation (DNR)   Continuous    Question Answer Comment  In the event of cardiac or respiratory ARREST Do not call a "code blue"   In the event of cardiac or respiratory ARREST Do not perform Intubation, CPR, defibrillation or ACLS   In the event of cardiac or respiratory ARREST Use medication by any route, position, wound care, and other measures to relive pain and suffering. May use oxygen, suction and manual treatment of airway obstruction as needed for comfort.      08/07/15 1803    Code Status History    Date Active Date Inactive Code Status Order ID Comments User Context   07/21/2015  2:23 AM 07/22/2015  8:18 PM DNR [id number]  Vianne Bulls, MD ED      Other Directives:None  Symptom Management:   Ativan, oxycodone for anxiety  Bronchodilators, oxycodone, morphine for breathing relaxation   Psycho-social/Spiritual:  Support System: Strong - wife, daughter, son-in-law at bedside Desire for further Chaplaincy support:YES Additional Recommendations: Education on Hospice  Prognosis: < 4 weeks. In the setting of end stage COPD, the patient has disabling dyspnea at rest, little/no response to bronchodilators, weakness, weight loss, severely reduced functional capacity and very little PO intake. He has been hospitalized twice in the past month for the same condition and can not maintain SpO2 >85%  on 10LNC.  Discharge Planning: Hospice facility   Chief Complaint/ Primary Diagnoses: Present on Admission:  . HCAP (healthcare-associated pneumonia) . Pneumonia . Tobacco abuse . Protein-calorie malnutrition, severe . Acute respiratory failure with hypoxia ([place])  I have reviewed the medical record, interviewed the patient and  family, and examined the patient. The following aspects are pertinent.  Past Medical History  Diagnosis Date  . COPD (chronic obstructive pulmonary disease) ([place])   . Hypertension   . CHF (congestive heart failure) ([place])   . Tobacco use   . Complication of anesthesia     "can't ever get me knocked out"  . On home oxygen therapy     "prn" (08/09/2015)  . Pneumonia X 2  . HCAP (healthcare-associated pneumonia) 08/09/2015  . Migraine     "once or twice/wk" (08/09/2015)  . Arthritis     "knees" (08/09/2015)  . Anxiety   . Depression   . Prostate cancer [facility])    Social History   Social History  . Marital Status: Married    Spouse Name: N/A  . Number of Children: N/A  . Years of Education: N/A   Social History Main Topics  . Smoking status: Current Every Day Smoker -- 2.50 packs/day for 70 years  . Smokeless tobacco: Never Used  . Alcohol Use: Yes     Comment: 08/09/2015 "I was a drunk; stopped ~ 30 yr ago"  . Drug Use: No  . Sexual Activity: Not Currently   Other Topics Concern  . None   Social History Narrative   History reviewed. No pertinent family history. Scheduled Meds: . arformoterol  15 mcg Nebulization BID  . budesonide (PULMICORT) nebulizer solution  0.5 mg Nebulization BID  . ceFEPime (MAXIPIME) IV  1 g Intravenous 3 times per day  . enoxaparin (LOVENOX) injection  40 mg Subcutaneous Q24H  . feeding supplement (ENSURE ENLIVE)  237 mL Oral TID BM  . furosemide  40 mg Intravenous Daily  . insulin aspart  0-5 Units Subcutaneous QHS  . insulin aspart  0-9 Units Subcutaneous TID WC  . ipratropium-albuterol  3 mL Nebulization Q6H WA  . magic mouthwash  10 mL Oral QID  . methylPREDNISolone (SOLU-MEDROL) injection  40 mg Intravenous Q12H  . multivitamin with minerals  1 tablet Oral Daily  . oxyCODONE  5 mg Oral Q8H   Continuous Infusions:  PRN Meds:.albuterol, LORazepam, ondansetron (ZOFRAN) IV, oxyCODONE Medications Prior to Admission:  Prior to Admission  medications   Medication Sig Start Date End Date Taking? Authorizing Provider  albuterol (PROVENTIL HFA;VENTOLIN HFA) 108 (90 Base) MCG/ACT inhaler Inhale 1-2 puffs into the lungs every 6 (six) hours as needed for wheezing or shortness of breath.   Yes Historical Provider, MD  amLODipine (NORVASC) 10 MG tablet Take 10 mg by mouth daily.   Yes Historical Provider, MD  budesonide-formoterol (SYMBICORT) 160-4.5 MCG/ACT inhaler Inhale 1 puff into the lungs 2 (two) times daily. 07/22/15  Yes Erline Hau, MD  ibuprofen (ADVIL,MOTRIN) 200 MG tablet Take 600 mg by mouth 2 (two) times daily as needed for moderate pain.   Yes Historical Provider, MD  Menthol, Topical Analgesic, (ICY HOT EX) Apply 1 application topically 2 (two) times daily as needed (for pian).   Yes Historical Provider, MD  metoprolol succinate (TOPROL-XL) 100 MG 24 hr tablet Take 100 mg by mouth daily. Take with or immediately following a meal.   Yes Historical Provider, MD  NASONEX 50 MCG/ACT nasal spray Place 1-2 sprays into both  nostrils daily. 07/15/15  Yes Historical Provider, MD  Multiple Vitamin (MULTIVITAMIN WITH MINERALS) TABS tablet Take 1 tablet by mouth daily. Patient not taking: Reported on 08/07/2015 07/22/15   Erline Hau, MD   Allergies  Allergen Reactions  . Flovent Diskus [Fluticasone Propionate (Inhal)] Other (See Comments)    headache    Review of Systems  Constitutional:       Reduced activity since being in the hospital. Weight loss No other constitutional symptoms.  HENT: Positive for mouth sores.   Eyes: Negative.   Respiratory: Positive for cough and shortness of breath.   Cardiovascular: Negative.   Gastrointestinal: Negative for nausea, vomiting, abdominal pain, constipation and abdominal distention.  Endocrine: Negative.   Genitourinary: Negative.   Musculoskeletal: Positive for back pain.  Skin: Negative.   Allergic/Immunologic: Negative.   Neurological: Positive for  weakness.  Hematological: Negative.   Psychiatric/Behavioral: Positive for agitation. The patient is nervous/anxious.     Physical Exam  Constitutional: He is oriented to person, place, and time.  HENT:  Head: Normocephalic and atraumatic.  Mouth/Throat: No oropharyngeal exudate.  Thrush-like rash on the oropharynx. Aphthous ulcers present on the oral mucosa  Eyes: EOM are normal.  Neck: Normal range of motion.  Cardiovascular: Normal rate, regular rhythm and normal heart sounds.   Respiratory: He is in respiratory distress. He has no wheezes. He has no rales. He exhibits no tenderness.  Distant breath sounds; reduced air movement in the lower lung fields L>R.   GI: Soft. Bowel sounds are normal. He exhibits no distension. There is no tenderness. There is no rebound and no guarding.  Musculoskeletal: He exhibits no edema.  Neurological: He is alert and oriented to person, place, and time.  Skin: Skin is warm and dry. No rash noted.  Psychiatric:  Anxiety    Vital Signs: BP 115/61 mmHg  Pulse 71  Temp(Src) 98.1 F (36.7 C) (Oral)  Resp 16  Ht 5' 8"  (1.727 m)  Wt 51.71 kg (114 lb)  BMI 17.34 kg/m2  SpO2 100%  SpO2: SpO2: 100 % O2 Device:SpO2: 100 % O2 Flow Rate: .O2 Flow Rate (L/min): 8 L/min  IO: Intake/output summary:   Intake/Output Summary (Last 24 hours) at 08/16/15 1329 Last data filed at 08/16/15 0553  Gross per 24 hour  Intake    700 ml  Output    875 ml  Net   -175 ml    LBM: Last BM Date: 08/09/15 Baseline Weight: Weight: 54.1 kg (119 lb 4.3 oz) Most recent weight: Weight: 51.71 kg (114 lb)      Palliative Assessment/Data:  Flowsheet Rows        Most Recent Value   Intake Tab    Referral Department  Hospitalist   Unit at Time of Referral  Cardiac/Telemetry Unit   Palliative Care Primary Diagnosis  Pulmonary   Date Notified  08/15/15   Palliative Care Type  New Palliative care   Reason for referral  Counsel Regarding Hospice, Clarify Goals of Care     Date of Admission  08/07/15   Date first seen by Palliative Care  08/16/15   # of days Palliative referral response time  1 Day(s)   # of days IP prior to Palliative referral  8   Clinical Assessment    Palliative Performance Scale Score  30%   Dyspnea Max Last 24 Hours  6   Dyspnea Min Last 24 hours  2   Anxiety Max Last 24 Hours  6  Anxiety Min Last 24 Hours  2   Psychosocial & Spiritual Assessment    Palliative Care Outcomes    Patient/Family meeting held?  Yes   Who was at the meeting?  patient, wife, daughter, son in law   Palliative Care Outcomes  Improved non-pain symptom therapy, Counseled regarding hospice, Changed to focus on comfort      Additional Data Reviewed:  CBC:    Component Value Date/Time   WBC 15.5* 08/14/2015 0607   HGB 13.2 08/14/2015 0607   HCT 41.1 08/14/2015 0607   PLT 185 08/14/2015 0607   MCV 91.7 08/14/2015 0607   NEUTROABS 8.1* 08/07/2015 1443   LYMPHSABS 0.6* 08/07/2015 1443   MONOABS 0.5 08/07/2015 1443   EOSABS 0.0 08/07/2015 1443   BASOSABS 0.0 08/07/2015 1443   Comprehensive Metabolic Panel:    Component Value Date/Time   NA 138 08/16/2015 0559   K 3.7 08/16/2015 0559   CL 95* 08/16/2015 0559   CO2 35* 08/16/2015 0559   BUN 36* 08/16/2015 0559   CREATININE 0.55* 08/16/2015 0559   GLUCOSE 165* 08/16/2015 0559   CALCIUM 8.7* 08/16/2015 0559   AST 25 07/20/2015 2309   ALT 20 07/20/2015 2309   ALKPHOS 59 07/20/2015 2309   BILITOT 0.6 07/20/2015 2309   PROT 8.3* 07/20/2015 2309   ALBUMIN 4.2 07/20/2015 2309     Time In: 10:00 Time Out: 11:10 Time Total: 70 Greater than 50%  of this time was spent counseling and coordinating care related to the above assessment and plan.  Signed by: Olga Coaster, PA-C  08/16/2015, 1:29 PM  Please contact Palliative Medicine Team phone at [phone] for questions and concerns.

## 2015-08-16 NOTE — Discharge Summary (Signed)
Physician Discharge Summary   Patient ID: Patrick Hobbs MRN: [id number] DOB/AGE: 1933-06-18 82 y.o.  Admit date: 08/07/2015 Discharge date: 08/16/2015  Primary Care Physician:  [facility]  Discharge Diagnoses:   . Acute on chronic respiratory failure with hypoxia ([place]) . HCAP (healthcare-associated pneumonia) . Severe protein calorie malnutrition  . Tobacco abuse . Protein-calorie malnutrition, severe . advance to severe stage IV COPD  Consults: Pulmonology Palliative medicine    DIET: Regular diet as tolerated    Allergies:   Allergies  Allergen Reactions  . Flovent Diskus [Fluticasone Propionate (Inhal)] Other (See Comments)    headache     DISCHARGE MEDICATIONS: Current Discharge Medication List    START taking these medications   Details  furosemide (LASIX) 40 MG tablet Take 1 tablet (40 mg total) by mouth daily. Qty: 30 tablet    LORazepam (ATIVAN) 1 MG tablet Take 1 tablet (1 mg total) by mouth every 6 (six) hours as needed for anxiety. Qty: 20 tablet, Refills: 0    magic mouthwash SOLN Take 10 mLs by mouth 4 (four) times daily. Qty: 100 mL, Refills: 0    oxyCODONE (ROXICODONE) 5 MG/5ML solution Take 5 mLs (5 mg total) by mouth every 2 (two) hours as needed (dyspnea). Qty: 30 mL, Refills: 0    predniSONE (DELTASONE) 10 MG tablet Prednisone dosing: Take  Prednisone 40mg  (4 tabs) x 3 days, then taper to 30mg  (3 tabs) x 3 days, then 20mg  (2 tabs) x 3days, then 10mg  (1 tab) x 3days, then OFF.      CONTINUE these medications which have NOT CHANGED   Details  albuterol (PROVENTIL HFA;VENTOLIN HFA) 108 (90 Base) MCG/ACT inhaler Inhale 1-2 puffs into the lungs every 6 (six) hours as needed for wheezing or shortness of breath.    budesonide-formoterol (SYMBICORT) 160-4.5 MCG/ACT inhaler Inhale 1 puff into the lungs 2 (two) times daily. Qty: 1 Inhaler, Refills: 12    Menthol, Topical Analgesic, (ICY HOT EX) Apply 1 application  topically 2 (two) times daily as needed (for pian).      STOP taking these medications     amLODipine (NORVASC) 10 MG tablet      ibuprofen (ADVIL,MOTRIN) 200 MG tablet      metoprolol succinate (TOPROL-XL) 100 MG 24 hr tablet      NASONEX 50 MCG/ACT nasal spray      Multiple Vitamin (MULTIVITAMIN WITH MINERALS) TABS tablet          Brief H and P: For complete details please refer to admission H and P, but in brief 82 y.o. male With PMHx of COPD- 3-4L O2 dependant, CHF (unknown kind), and current tobacco use (1 cigarette per day). He was recently seen and d/c'd from [facility] after having the flu/COPD exacerbation. Per wife, patient did "okay" upon discharge. He has not felt well for about 3 months. Grandson said he did better while on the steroid taper. He comes in today via EMS [facility] with c/o fatigue, weakness, and worsening SOB. He was hypoxic when EMS arrived on his 4L O2 into the 80s. Chest x-ray with right lower lobe pneumonia, and he was admitted and started on treatment for healthcare associated pneumonia.  Hospital Course:   Healthcare associated pneumonia, acute on chronic respiratory failure with hypoxia, chronically on 3-4 L O2 at home Patient was admitted with acute on chronic hypoxic respiratory failure, COPD exacerbation and right lower lobe pneumonia. Patient was placed on the BiPAP. He did not  want to be intubated. Patient had prolonged hospitalization with difficult weaning off BiPAP, Ventimask and NRB mask. Patient is still requiring O2 8 L high flow, has shortness of breath with minimal exertion. - Weaned off from Ventimask, however still on high flow O2 7 L, Unable to wean O2 down further. The patient was placed on broad-spectrum IV antibiotics, vancomycin was discontinued on 3/17. He was placed on IV Lasix, IV Solu-Medrol, Pulmicort, Brovana.   Blood cultures and sputum cultures are negative so far, Urine legionella antigen, urine strep antigen  negative - Steroids tapered, appreciate pulmonology recommendations , recommended palliative medicine Pulmonology consult was obtained, patient had maximized therapy with no significant improvement hence palliative medicine consult was recommended. Palliative medicine saw the patient on 3/20 for goals of care meeting with patient and family, recommended hospice. Patient is accepted to inpatient hospice.  Acute on chronic respiratory failure with hypoxia in the setting of COPD exacerbation, HCAP, recent influenza - patient with long-standing tobacco abuse suspect underlying advanced COPD and chronic respiratory failure at home - CTA chest: NO PE, b/l lower lobe consolidation, right mid lung infiltrates, b/l pleural effusions, rt>left, patient was placed on Lasix for diuresis.  - 2-D echo 3/13 had shown EF of 60-65%, negative balance of 2.6 L   - Palliative medicine consulted, recommended hospice at this time.  Questionable A flutter - EKGs reviewed, there are 2 EKGs obtained on admission during respiratory distress, EKGs show a flutter, however lead III looks to be in sinus rhythm, NSR on telemetry   Tobacco abuse - Patient counseled on smoking cessation   Anxiety - low dose lorazepam, oxycodone for shortness of breath and pain  Day of Discharge BP 115/61 mmHg  Pulse 71  Temp(Src) 98.1 F (36.7 C) (Oral)  Resp 16  Ht 5\' 8"  (1.727 m)  Wt 51.71 kg (114 lb)  BMI 17.34 kg/m2  SpO2 100%  Physical Exam: General: Alert and awake oriented x3 not in any acute distress. HEENT: anicteric sclera, pupils reactive to light and accommodation CVS: S1-S2 clear no murmur rubs or gallops Chest: Decreased breath sounds throughout with scattered rhonchi Abdomen: soft nontender, nondistended, normal bowel sounds Extremities: no cyanosis, clubbing or edema noted bilaterally Neuro: Cranial nerves II-XII intact, no focal neurological deficits   The results of significant diagnostics from this  hospitalization (including imaging, microbiology, ancillary and laboratory) are listed below for reference.    LAB RESULTS: Basic Metabolic Panel:  Recent Labs Lab 08/15/15 0514 08/15/15 1114 08/16/15 0559  NA 138  --  138  K 3.4*  --  3.7  CL 95*  --  95*  CO2 36*  --  35*  GLUCOSE 164*  --  165*  BUN 42*  --  36*  CREATININE 0.63  --  0.55*  CALCIUM 8.4*  --  8.7*  MG  --  2.3  --    Liver Function Tests: No results for input(s): AST, ALT, ALKPHOS, BILITOT, PROT, ALBUMIN in the last 168 hours. No results for input(s): LIPASE, AMYLASE in the last 168 hours. No results for input(s): AMMONIA in the last 168 hours. CBC:  Recent Labs Lab 08/13/15 0530 08/14/15 0607  WBC 11.5* 15.5*  HGB 13.5 13.2  HCT 41.6 41.1  MCV 91.2 91.7  PLT 206 185   Cardiac Enzymes: No results for input(s): CKTOTAL, CKMB, CKMBINDEX, TROPONINI in the last 168 hours. BNP: Invalid input(s): POCBNP CBG:  Recent Labs Lab 08/16/15 0807 08/16/15 1129  GLUCAP 161* 103*  Significant Diagnostic Studies:  Dg Chest Portable 1 Hobbs  08/07/2015  CLINICAL DATA:  82 year old male with progressive shortness of breath EXAM: PORTABLE CHEST 1 Hobbs COMPARISON:  Prior chest x-ray 07/20/2015 FINDINGS: New patchy airspace opacity throughout the right lower lobe with thickening along the minor fissure. The cardiac and mediastinal contours are within normal limits. The left lung remains relatively well aerated. Background of emphysema and chronic bronchitic changes is similar compared to prior. No pleural effusion or pneumothorax. No acute osseous abnormality. IMPRESSION: Interval development of patchy right lower lobe airspace opacity concerning for bronchopneumonia. Electronically Signed   By: Jacqulynn Cadet M.D.   On: 08/07/2015 15:23    2D ECHO:   Disposition and Follow-up: Discharge Instructions    Diet general    Complete by:  As directed      Increase activity slowly    Complete by:  As directed              DISPOSITION: Hospice   DISCHARGE FOLLOW-UP Follow-up Information    Follow up with [facility]. Schedule an appointment as soon as possible for a visit in 2 weeks.   Why:  As needed, If symptoms worsen   Contact information:   [address] [place] [address] [phone]        Time spent on Discharge: 25 minutes  Signed:   Shaft Hobbs M.D. [facility] 08/16/2015, 12:32 PM Pager: [phone]

## 2015-08-16 NOTE — Clinical Social Work Note (Addendum)
Per MD patient ready to DC to [facility]. RN, patient/family (daughter and spouse at bedside), and facility notified of patient's DC. RN given number for report. DC packet on patient's chart. Ambulance transport requested for patient. Cindy with facility asks that patient be set up for transport at this time. CSW unable to complete full assessment with patient and family due to same day discharge. Family and patient are tearful but feel they have made the right choice. CSW signing off at this time.   Liz Beach MSW, [place], [place], [phone]

## 2015-10-28 DEATH — deceased

## 2016-05-12 IMAGING — CT CT ANGIO CHEST
2 of 10 series · 19 of 46 positions shown · IV contrast (omnipaque)
Comparison: None

CLINICAL DATA: Fatigue, weakness, worsening shortness of breath,
oxygen desaturation in 80s, RIGHT lower lobe pneumonia, COPD,
hypertension, CHF, smoker, history prostate cancer

EXAM:
CT ANGIOGRAPHY CHEST WITH CONTRAST
TECHNIQUE: Multidetector CT imaging of the chest was performed using the
standard protocol during bolus administration of intravenous
contrast. Multiplanar CT image reconstructions and MIPs were
obtained to evaluate the vascular anatomy.
CONTRAST:  100mL OMNIPAQUE IOHEXOL 350 MG/ML SOLN IV

[Series 6: thins · axial · 0.73mm/px · z∈[+1467,+1753]mm · 16 of 326 slices shown]
[im 20/326  lung]
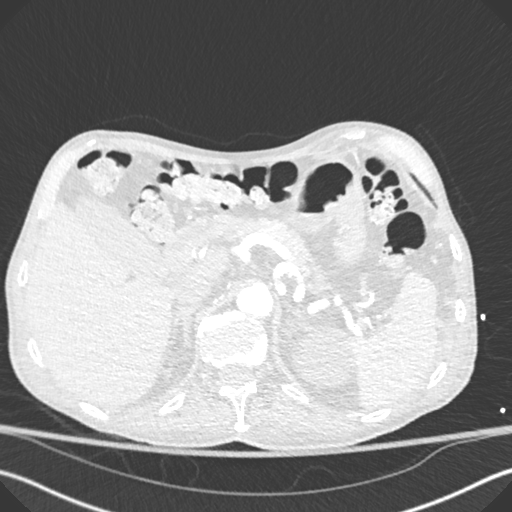
[im 39/326  soft-tissue]
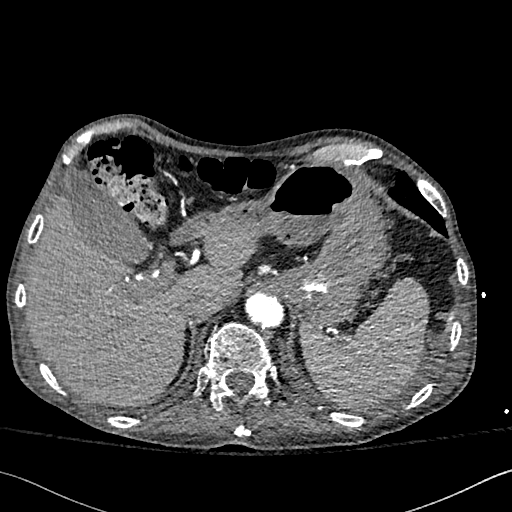
[im 58/326  lung]
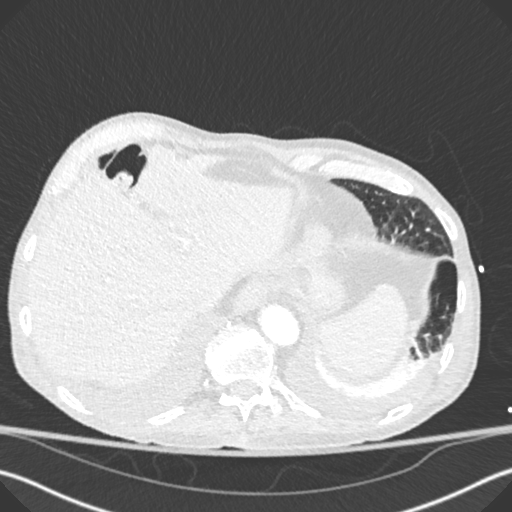
[im 77/326  soft-tissue]
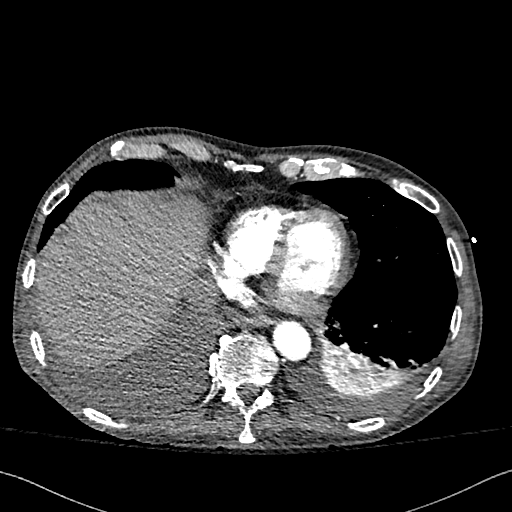
[im 96/326  lung]
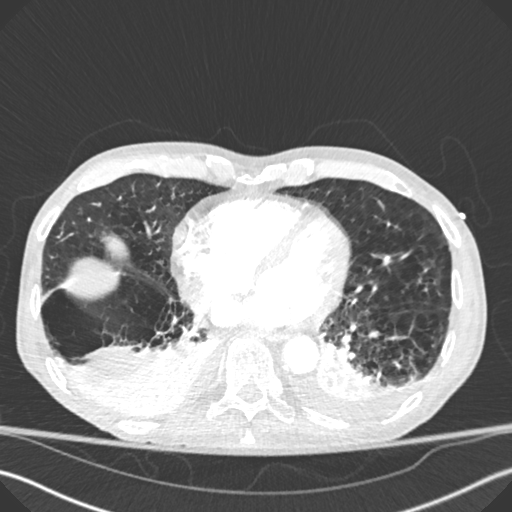
[im 115/326  soft-tissue]
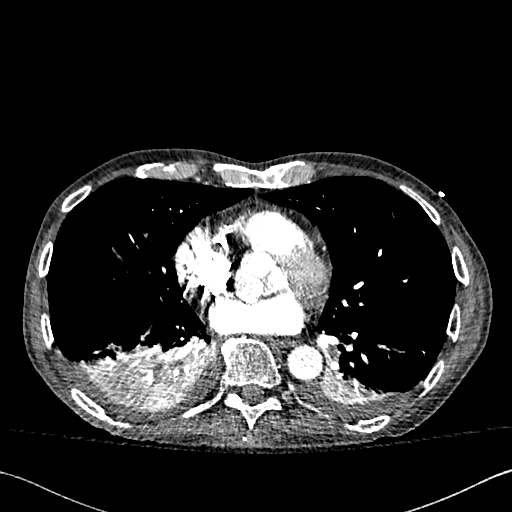
[im 134/326  lung]
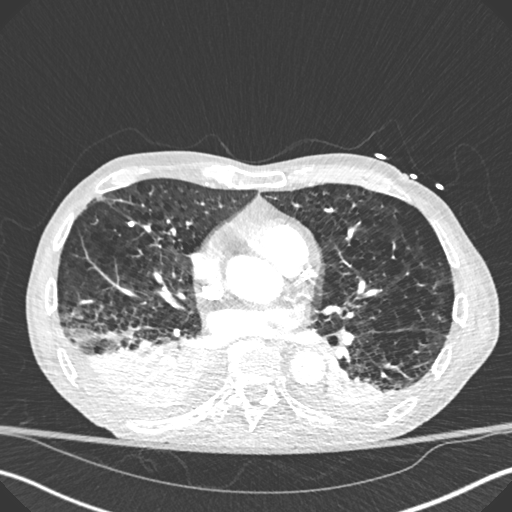
[im 153/326  soft-tissue]
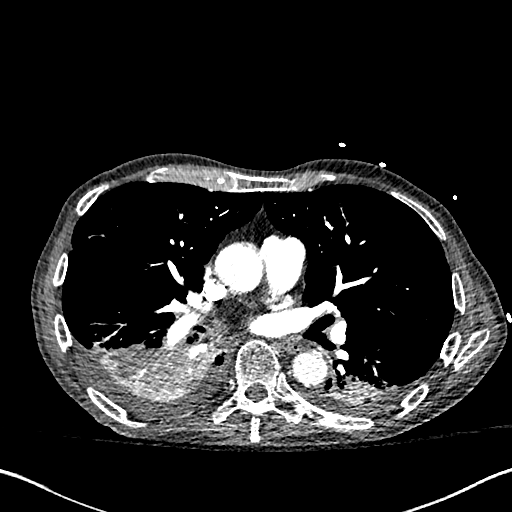
[im 173/326  lung]
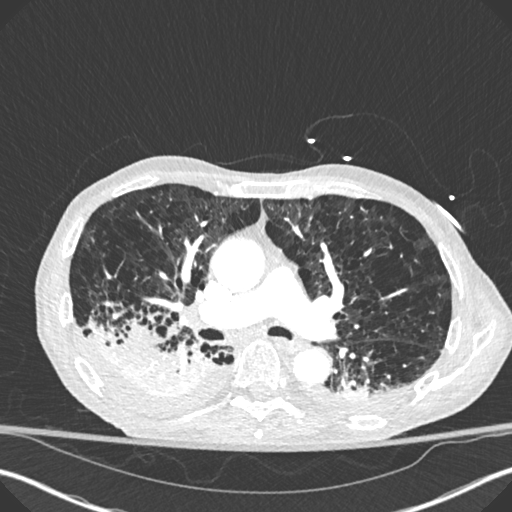
[im 192/326  soft-tissue]
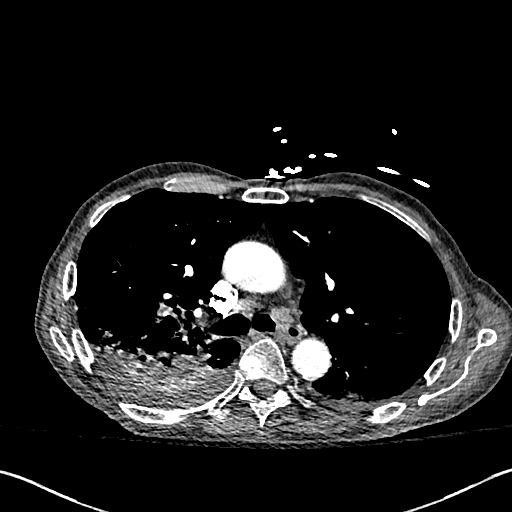
[im 211/326  lung]
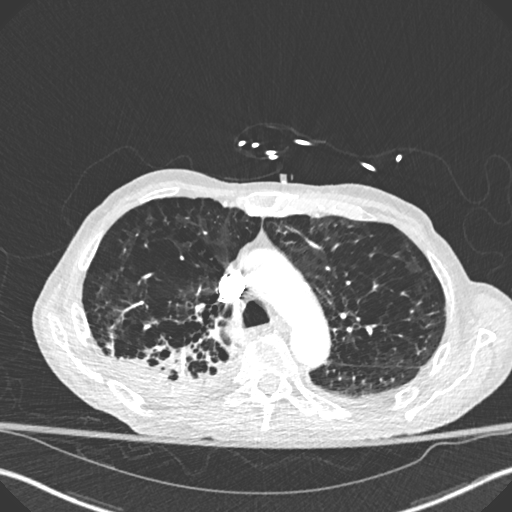
[im 230/326  soft-tissue]
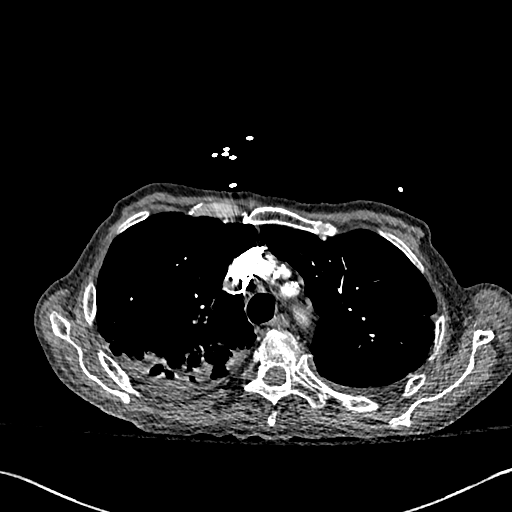
[im 249/326  lung]
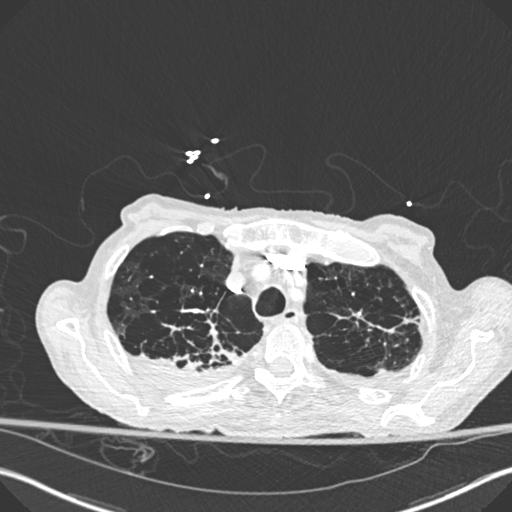
[im 268/326  soft-tissue]
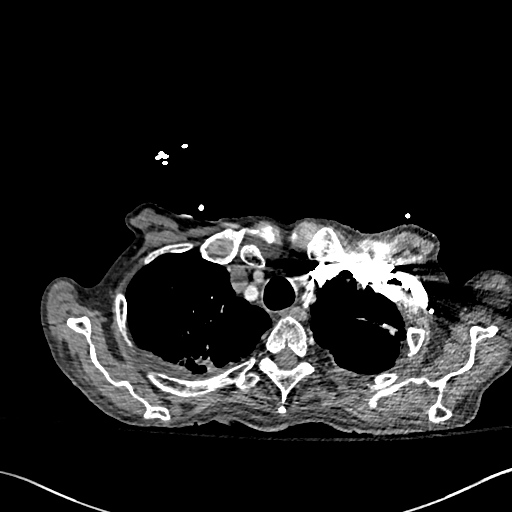
[im 287/326  lung]
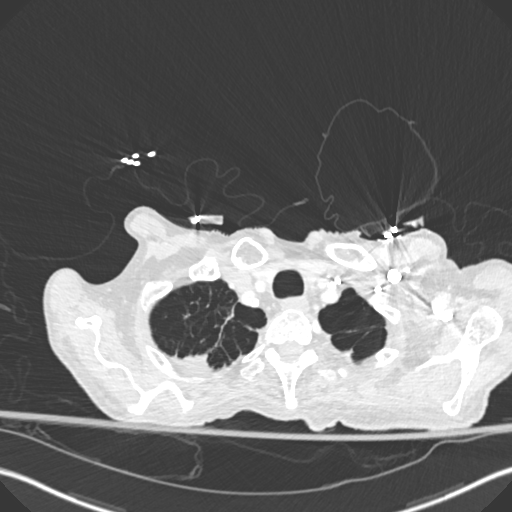
[im 306/326  soft-tissue]
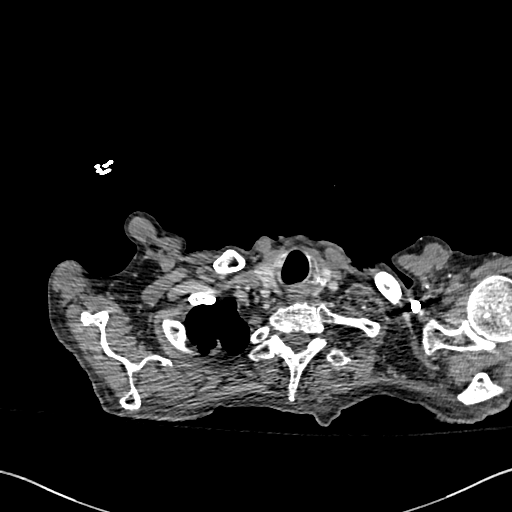

[Series 8: coronal mpr · coronal · 0.64mm/px · 3 of 106 slices shown]
[im 27/106  soft-tissue]
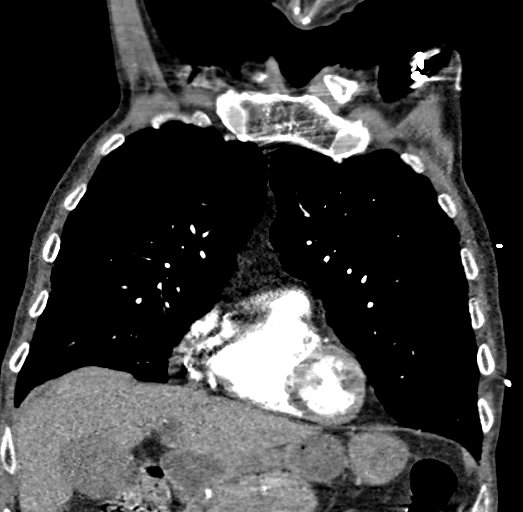
[im 53/106  soft-tissue]
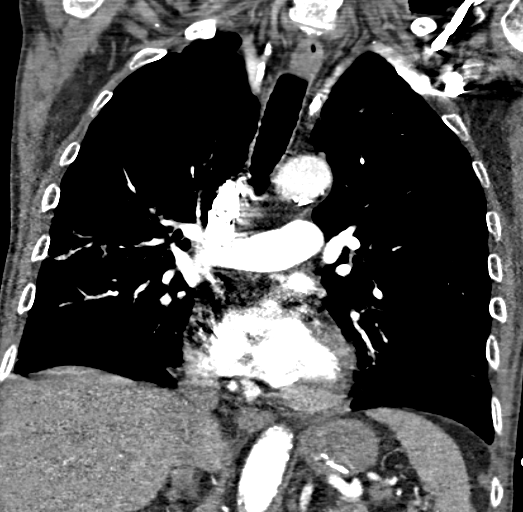
[im 79/106  soft-tissue]
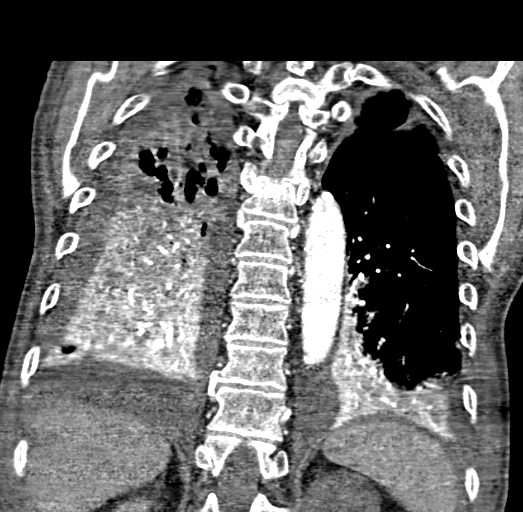

[19 of 46 positions shown; findings below may reference images not displayed]

FINDINGS: Scattered atherosclerotic calcifications aorta, proximal great
vessels and coronary arteries.

Upper normal caliber ascending thoracic aorta 3.8 cm diameter image
48.

No aortic dissection.

Pulmonary arteries well opacified and grossly patent.

No evidence of pulmonary embolism.

Minimally prominent precarinal lymph node 11 mm short axis image 40.

Additional scattered normal sized mediastinal and hilar nodes.

Large cyst upper pole LEFT kidney 6.0 x 5.5 cm image 110.

Stomach incompletely distended, unable to exclude gastric wall
thickening.

Remaining visualized upper abdomen unremarkable.

BILATERAL pleural effusions RIGHT greater than LEFT.

BILATERAL lower lobe consolidation and volume loss greater on RIGHT.

Severe underlying emphysematous changes with mild infiltrate in the
posterior RIGHT upper lobe.

Scattered central peribronchial thickening.

No pneumothorax or definite mass/nodule.

Bones demineralized.

Review of the MIP images confirms the above findings.
IMPRESSION: No evidence of pulmonary embolism.

COPD changes with BILATERAL lower lobe consolidation and volume loss
RIGHT greater than LEFT with additional mild infiltrate in posterior
RIGHT upper lobe.

BILATERAL pleural effusions RIGHT greater than LEFT.

Large LEFT renal cyst.

## 2016-05-13 IMAGING — DX DG CHEST 1V PORT
1 series · 1 of 1 positions shown · non-contrast
Comparison: 08/11/2015, [DATE] [DATE]

CLINICAL DATA: Hypoxia.

EXAM:
PORTABLE CHEST 1 VIEW

[chest ap]
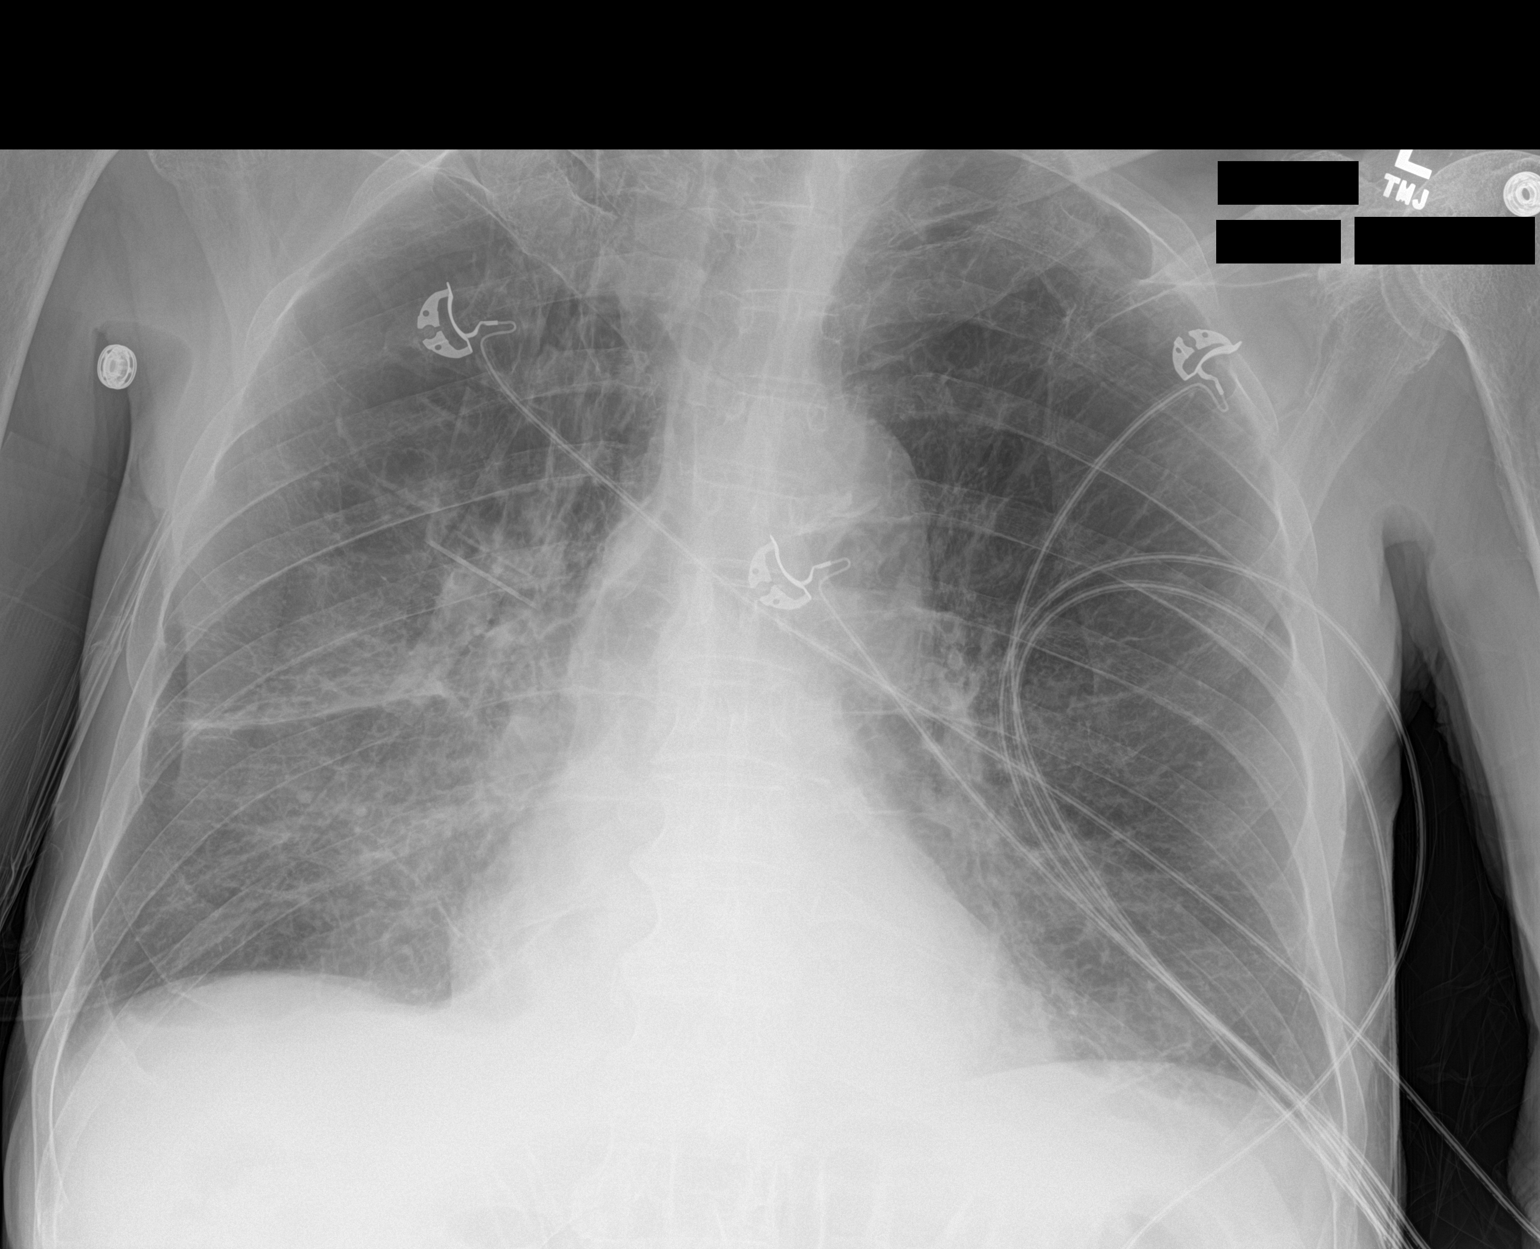

[1 of 1 positions shown; findings below may reference images not displayed]

FINDINGS: Lungs are mildly hyperinflated. Emphysematous changes are present.
There has been some improvement in aeration of the lung bases,
consistent with resolving edema.
IMPRESSION: 1. Improving aeration.
2. Persistent bibasilar opacities.
3. Emphysema.
